# Patient Record
Sex: Female | Born: 1975 | Race: Black or African American | Hispanic: No | Marital: Single | Smoking: Former smoker
Health system: Southern US, Community
[De-identification: ages and names within clinical notes are randomized; demographics above are authoritative.]

## PROBLEM LIST (undated history)

## (undated) DIAGNOSIS — N760 Acute vaginitis: Secondary | ICD-10-CM

## (undated) DIAGNOSIS — B9689 Other specified bacterial agents as the cause of diseases classified elsewhere: Secondary | ICD-10-CM

## (undated) DIAGNOSIS — N83201 Unspecified ovarian cyst, right side: Principal | ICD-10-CM

## (undated) DIAGNOSIS — J45909 Unspecified asthma, uncomplicated: Secondary | ICD-10-CM

## (undated) DIAGNOSIS — I10 Essential (primary) hypertension: Secondary | ICD-10-CM

## (undated) HISTORY — DX: Unspecified ovarian cyst, right side: N83.201

## (undated) HISTORY — DX: Acute vaginitis: N76.0

## (undated) HISTORY — DX: Other specified bacterial agents as the cause of diseases classified elsewhere: B96.89

---

## 2016-12-07 ENCOUNTER — Emergency Department: Payer: Medicaid - Out of State

## 2016-12-07 ENCOUNTER — Encounter: Payer: Self-pay | Admitting: Emergency Medicine

## 2016-12-07 ENCOUNTER — Emergency Department
Admission: EM | Admit: 2016-12-07 | Discharge: 2016-12-07 | Disposition: A | Discharge status: Home / Self Care | Payer: Medicaid - Out of State | Attending: Emergency Medicine | Admitting: Emergency Medicine

## 2016-12-07 DIAGNOSIS — R1031 Right lower quadrant pain: Secondary | ICD-10-CM

## 2016-12-07 DIAGNOSIS — Z87891 Personal history of nicotine dependence: Secondary | ICD-10-CM | POA: Insufficient documentation

## 2016-12-07 DIAGNOSIS — I10 Essential (primary) hypertension: Secondary | ICD-10-CM | POA: Insufficient documentation

## 2016-12-07 DIAGNOSIS — N949 Unspecified condition associated with female genital organs and menstrual cycle: Secondary | ICD-10-CM | POA: Diagnosis not present

## 2016-12-07 DIAGNOSIS — J45909 Unspecified asthma, uncomplicated: Secondary | ICD-10-CM | POA: Diagnosis not present

## 2016-12-07 HISTORY — DX: Essential (primary) hypertension: I10

## 2016-12-07 HISTORY — DX: Unspecified asthma, uncomplicated: J45.909

## 2016-12-07 LAB — COMPREHENSIVE METABOLIC PANEL
ALT: 10 U/L — AB (ref 14–54)
AST: 17 U/L (ref 15–41)
Albumin: 3.8 g/dL (ref 3.5–5.0)
Alkaline Phosphatase: 55 U/L (ref 38–126)
Anion gap: 5 (ref 5–15)
BUN: 13 mg/dL (ref 6–20)
CHLORIDE: 103 mmol/L (ref 101–111)
CO2: 28 mmol/L (ref 22–32)
Calcium: 8.9 mg/dL (ref 8.9–10.3)
Creatinine, Ser: 0.85 mg/dL (ref 0.44–1.00)
GFR calc Af Amer: >60 mL/min (ref >60)
Glucose, Bld: 104 mg/dL — ABNORMAL HIGH (ref 65–99)
POTASSIUM: 3.8 mmol/L (ref 3.5–5.1)
SODIUM: 136 mmol/L (ref 135–145)
Total Bilirubin: 0.8 mg/dL (ref 0.3–1.2)
Total Protein: 6.5 g/dL (ref 6.5–8.1)

## 2016-12-07 LAB — URINALYSIS, COMPLETE (UACMP) WITH MICROSCOPIC
Bilirubin Urine: NEGATIVE
GLUCOSE, UA: NEGATIVE mg/dL
KETONES UR: NEGATIVE mg/dL
Leukocytes, UA: NEGATIVE
Nitrite: NEGATIVE
PROTEIN: NEGATIVE mg/dL
Specific Gravity, Urine: 1.014 (ref 1.005–1.030)
pH: 5 (ref 5.0–8.0)

## 2016-12-07 LAB — CBC
HEMATOCRIT: 40 % (ref 35.0–47.0)
HEMOGLOBIN: 13.2 g/dL (ref 12.0–16.0)
MCH: 31 pg (ref 26.0–34.0)
MCHC: 33 g/dL (ref 32.0–36.0)
MCV: 94.1 fL (ref 80.0–100.0)
Platelets: 171 10*3/uL (ref 150–440)
RBC: 4.25 MIL/uL (ref 3.80–5.20)
RDW: 14.9 % — ABNORMAL HIGH (ref 11.5–14.5)
WBC: 6.6 10*3/uL (ref 3.6–11.0)

## 2016-12-07 LAB — LIPASE, BLOOD: LIPASE: 28 U/L (ref 11–51)

## 2016-12-07 LAB — PREGNANCY, URINE: Preg Test, Ur: NEGATIVE

## 2016-12-07 MED ORDER — SODIUM CHLORIDE 0.9 % IV BOLUS (SEPSIS)
1000.0000 mL | Freq: Once | INTRAVENOUS | Status: AC
  Administered 2016-12-07: 1000 mL via INTRAVENOUS

## 2016-12-07 MED ORDER — IOPAMIDOL (ISOVUE-300) INJECTION 61%
30.0000 mL | Freq: Once | INTRAVENOUS | Status: AC | PRN
  Administered 2016-12-07: 30 mL via ORAL

## 2016-12-07 MED ORDER — MORPHINE SULFATE (PF) 4 MG/ML IV SOLN
4.0000 mg | Freq: Once | INTRAVENOUS | Status: AC
  Administered 2016-12-07: 4 mg via INTRAVENOUS
  Filled 2016-12-07: qty 1

## 2016-12-07 MED ORDER — IOPAMIDOL (ISOVUE-300) INJECTION 61%
100.0000 mL | Freq: Once | INTRAVENOUS | Status: AC | PRN
  Administered 2016-12-07: 100 mL via INTRAVENOUS

## 2016-12-07 MED ORDER — HYDROCODONE-ACETAMINOPHEN 5-325 MG PO TABS: 1.0000 | ORAL_TABLET | ORAL | 0 refills | Status: DC | PRN

## 2016-12-07 MED ORDER — ONDANSETRON 4 MG PO TBDP: 4.0000 mg | ORAL_TABLET | Freq: Three times a day (TID) | ORAL | 0 refills | Status: DC | PRN

## 2016-12-07 MED ORDER — ONDANSETRON HCL 4 MG/2ML IJ SOLN
4.0000 mg | Freq: Once | INTRAMUSCULAR | Status: AC
  Administered 2016-12-07: 4 mg via INTRAVENOUS
  Filled 2016-12-07: qty 2

## 2016-12-07 NOTE — Discharge Instructions (Signed)
Please follow-up with Dr. Jean RosenthalJackson tomorrow morning at 10 AM. Please arrive 20 minutes early. Please take your pain medication as needed, as prescribed. Return to the emergency department for any acute worsening of pain, fever, or any other symptom personally concerning to yourself.

## 2016-12-07 NOTE — ED Provider Notes (Signed)
[facility] Emergency Department Provider Note  Time seen: 8:23 AM  I have reviewed the triage vital signs and the nursing notes.   HISTORY  Chief Complaint Flank Pain    HPI Autumn Lewis is a 41 y.o. female with a past medical history of hypertension, asthma, presents to the emergency department right lower quadrant abdominal pain. According to the patient for the past 2 days she's had a progressively worsening right lower quadrant pain that radiated somewhat into her right back. Patient states nausea but denies vomiting. States some pain when she urinates. Denies vaginal bleeding or discharge. Her last menstrual period was approximately 2 weeks ago. States a history of one prior kidney stone 2 years ago, but says this does not feel similar. Denies hematuria. Largely negative review of systems.  Past Medical History:  Diagnosis Date  . Asthma   . Hypertension     There are no active problems to display for this patient.   History reviewed. No pertinent surgical history.  Prior to Admission medications   Not on File    No Known Allergies  History reviewed. No pertinent family history.  Social History Social History  Substance Use Topics  . Smoking status: Former Games developer  . Smokeless tobacco: Never Used  . Alcohol use Yes    Review of Systems Constitutional: Negative for fever. ENT: Negative for congestion Cardiovascular: Negative for chest pain. Respiratory: Negative for shortness of breath. Gastrointestinal: Right lower quadrant pain. Positive for nausea. Negative for vomiting or diarrhea. Genitourinary: Negative for dysuria. Negative for hematuria. Negative vaginal bleeding or discharge. Musculoskeletal: Some radiation of the pain to the right lower back Skin: Negative for rash. Neurological: Negative for headache All other ROS negative  ____________________________________________   PHYSICAL EXAM:  VITAL SIGNS: ED Triage Vitals   Enc Vitals Group     BP --      Pulse --      Resp --      Temp 12/07/16 0801 98.6 F (37 C)     Temp Source 12/07/16 0801 Oral     SpO2 --      Weight 12/07/16 0800 138 lb (62.6 kg)     Height 12/07/16 0800 5\' 5"  (1.651 m)     Head Circumference --      Peak Flow --      Pain Score 12/07/16 0800 7     Pain Loc --      Pain Edu? --      Excl. in GC? --     Constitutional: Alert and oriented. Well appearing and in no distress. Eyes: Normal exam ENT   Head: Normocephalic and atraumatic   Mouth/Throat: Mucous membranes are moist. Cardiovascular: Normal rate, regular rhythm. No murmur Respiratory: Normal respiratory effort without tachypnea nor retractions. Breath sounds are clear Gastrointestinal: Soft, moderate right lower quadrant tenderness to palpation, rebound tenderness, no guarding, no distention. Musculoskeletal: Nontender with normal range of motion in all extremities. No lower extremity tenderness or edema. Some tenderness in the right great toe which she states is been ongoing for years. Neurologic:  Normal speech and language. No gross focal neurologic deficits  Skin:  Skin is warm, dry and intact.  Psychiatric: Mood and affect are normal.   ____________________________________________   RADIOLOGY  IMPRESSION: 1. 6 cm septated right adnexal cyst which needs sonographic workup. No superimposed inflammatory changes to suggest TOA in this patient with right lower quadrant pain. 2. Negative for appendicitis. 3. Question small hydrosalpinx on the left.  4. 22 mm mass along the low left inguinal canal favoring ascitic fluid within a hernia. The upper inguinal canal is not enlarged, and a round ligament mass versus is not excluded. Recommend physical exam correlation. 5. 2.6 cm right adrenal mass, usually an adenoma but not definitively characterized on this exam. At this size, guidelines recommend adrenal protocol CT if there is no outside comparison. 6. 10  mm left renal calculus, nonobstructing. 7. Aortic Atherosclerosis (ICD10-I70.0).   IMPRESSION: Three large right ovarian cysts, including a complicated 4.6 cm right ovarian cyst. Since these may be difficult to assess completely with US, further evaluation of simple-appearing cysts >7 cm with MRI or surgical evaluation is recommended according to the Society of Radiologists in Ultrasound 2010 Consensus Conference Statement (D Lenis NoonLevine et al. Management of Asymptomatic Ovarian and other Adnexal Cysts Imaged at US: Society of Radiologists in Ultrasound Consensus Conference Statement 2010. Radiology 256 (Sept 2010): 943-954.).  Normal blood flow to both ovaries.  Normal appearance of the uterus.  ____________________________________________   INITIAL IMPRESSION / ASSESSMENT AND PLAN / ED COURSE  Pertinent labs & imaging results that were available during my care of the patient were reviewed by me and considered in my medical decision making (see chart for details).  The patient presents to the emergency department for right lower quadrant abdominal pain ongoing for the past 2 days progressively worsening with nausea. Denies vomiting or diarrhea. States some pain when she urinates, but denies burning pain she states the pain is more in her right lower quadrant when she urinates. Patient has a history of one prior kidney stone in the past. However she states this does not feel similar. Patient has moderate right lower quadrant tenderness, has rebound tenderness when releasing left lower quadrant palpation, has a positive heel tap sign. Exam is suspicious for acute appendicitis, differential would also include urinary pathology such as UTI or ureteral stone.  CT scan shows large right adnexal cyst. Ultrasound shows 3 large ovarian cysts with a complicated 4.6 cm cysts and 2 other larger but benign appearing cyst. This could very likely be the cause of the patient's discomfort. I discussed the  patient with Dr. Tiburcio PeaHarris of OB/GYN. He states the patient will likely need surgery for cyst removal. Patient states her pain is down to a 5 out of 10. I discussed the options with the patient as far as admission to the hospital for gynecologic consultation and possible surgery versus discharge home with outpatient follow-up in the office tomorrow. Patient would prefer to go home. We will discharge the pain and nausea medication. I discussed with the patient return precautions for any acute worsening pain or fever. The patient is agreeable to this plan. We will call Dr. Johnathan HausenHarris's office to arrange a follow-up appointment tomorrow for the patient.   ____________________________________________   FINAL CLINICAL IMPRESSION(S) / ED DIAGNOSES  Right lower quadrant pain Right adnexal cyst   Minna AntisPaduchowski, Genni Buske, MD 12/07/16 1325

## 2016-12-07 NOTE — ED Notes (Signed)
ED Provider at bedside. 

## 2016-12-07 NOTE — ED Notes (Signed)
CT notified that pt is ready for scan.

## 2016-12-07 NOTE — ED Triage Notes (Addendum)
Pt c/o right flank pain radiating to right groin.  Started couple days ago. Does have hx kidney stones, feels similar per pt. Pt also reports 2 periods in May. Does have nausea. No hematuria per pt.

## 2016-12-08 ENCOUNTER — Encounter: Payer: Self-pay | Admitting: Obstetrics and Gynecology

## 2016-12-08 ENCOUNTER — Ambulatory Visit (INDEPENDENT_AMBULATORY_CARE_PROVIDER_SITE_OTHER): Payer: 59 | Admitting: Obstetrics and Gynecology

## 2016-12-08 VITALS — BP 124/78 | Ht 65.0 in | Wt 141.0 lb

## 2016-12-08 DIAGNOSIS — N83201 Unspecified ovarian cyst, right side: Secondary | ICD-10-CM

## 2016-12-08 NOTE — Progress Notes (Signed)
Obstetrics & Gynecology Office Visit   Chief Complaint  Patient presents with  . Follow-up  Right lower quadrant pain Follow up from ER for right ovarian/adnexal cysts  History of Present Illness: 41 y.o. G0P0000 female seen today in follow up from an ER visit yesterday.  She presented to the ER with progressively worsening right lower quadrant abdominal pain. The pain radiated to her back somewhat.  She had a pelvic ultrasound that showed the following: Per report Uterus: 8.6 x 4.2 x 4.7 cm, no fibroids or other masses seen. Endometrium: 3 mm thickness without obvious abnormalities  Right ovary: 7.5 x 4.7 x 5.0 cm.  There is an 8.0 x 2.1 x 5.9 cm simple-appearing right ovarian cyst,  A 3.7 x 4.6 x 4.3 cm simple ovarian cyst and a 4.6 x 2.8 x 2.8 complicated right ovarian cyst without internal blood flow.  Left ovary:  3.3 x 1.4 x 2.0 cm. Normal in appearance.   Normal pulse doppler wave forms.  She has no symptoms today but would like to understand management of the cysts.     Past Medical History:  Diagnosis Date  . Asthma   . Hypertension    Past Surgical history: denies  Gynecologic History: Patient's last menstrual period was 11/19/2016 (approximate).  Obstetric History: G0P0000  Family History  Problem Relation Age of Onset  . Hypertension Father   . Diabetes Mellitus II Maternal Grandfather     Social History   Social History  . Marital status: Single    Spouse name: N/A  . Number of children: N/A  . Years of education: N/A   Occupational History  . Not on file.   Social History Main Topics  . Smoking status: Former Games developer  . Smokeless tobacco: Never Used  . Alcohol use Yes  . Drug use: No  . Sexual activity: Yes    Birth control/ protection: None   Other Topics Concern  . Not on file   Social History Narrative  . No narrative on file   Allergies: No Known Allergies    Medication Sig Start Date End Date Taking? Authorizing Provider    albuterol (PROVENTIL HFA;VENTOLIN HFA) 108 (90 Base) MCG/ACT inhaler Inhale 2 puffs into the lungs every 6 (six) hours as needed for wheezing or shortness of breath.    [provider]  fluticasone furoate-vilanterol (BREO ELLIPTA) 200-25 MCG/INH AEPB Inhale 1 puff into the lungs daily.    [provider]  hydrochlorothiazide (HYDRODIURIL) 25 MG tablet Take 25 mg by mouth daily.    [provider]  HYDROcodone-acetaminophen (NORCO/VICODIN) 5-325 MG tablet Take 1 tablet by mouth every 4 (four) hours as needed. 12/07/16   Minna Antis, MD  loratadine (CLARITIN) 10 MG tablet Take 10 mg by mouth daily.    [provider]  montelukast (SINGULAIR) 10 MG tablet Take 10 mg by mouth at bedtime.    [provider]  ondansetron (ZOFRAN ODT) 4 MG disintegrating tablet Take 1 tablet (4 mg total) by mouth every 8 (eight) hours as needed for nausea or vomiting. 12/07/16   Minna Antis, MD   Review of Systems  Constitutional: Negative.   HENT: Negative.   Eyes: Negative.   Respiratory: Negative.   Cardiovascular: Negative.   Gastrointestinal: Negative.   Genitourinary: Negative.   Musculoskeletal: Negative.   Skin: Negative.   Neurological: Negative.   Psychiatric/Behavioral: Negative.      Physical Exam BP 124/78   Ht 5\' 5"  (1.651 m)   Wt 141  lb (64 kg)   LMP 11/19/2016 (Approximate) Comment: neg preg test  BMI 23.46 kg/m  Patient's last menstrual period was 11/19/2016 (approximate). Physical Exam  Constitutional: She is oriented to person, place, and time. She appears well-developed and well-nourished. No distress.  HENT:  Head: Normocephalic and atraumatic.  Eyes: EOM are normal. No scleral icterus.  Neck: Normal range of motion. Neck supple.  Cardiovascular: Normal rate and regular rhythm.   Pulmonary/Chest: Effort normal and breath sounds normal. No respiratory distress. She has no wheezes. She has no rales.  Abdominal: Soft. Bowel  sounds are normal. She exhibits no distension and no mass. There is no tenderness. There is no rebound and no guarding.  Musculoskeletal: Normal range of motion. She exhibits no edema.  Neurological: She is alert and oriented to person, place, and time. No cranial nerve deficit.  Skin: Skin is warm and dry. No erythema.  Psychiatric: She has a normal mood and affect. Her behavior is normal. Judgment normal.    Assessment: 41 y.o. G0P0000 female with several right ovarian cysts, one of them large and one of them complicated Plan: Problem List Items Addressed This Visit    Right ovarian cyst - Primary   Relevant Orders   MR PELVIS W WO CONTRAST     Long discussion regarding management. She prefers to avoid surgery. It is possible that we could watch the cysts. Recommendation for MRI for further characterization of the cysts. She understands that she might still need surgery to remove the cysts.  Torsion precautions given to her in case pain returns.  If pain does return, may need emergent surgery or surgery prior to MRI characterization. Discussed that cysts are most likely benign.  But, I can not tell her definitively that they are without removing them. MRI would give best characterization of the cysts.  30 minutes spent in face to face discussion with > 50% spent in counseling and management of her ovarian cysts. I also personally reviewed her ultrasound images and report along with her lab results and notes from her ER visit.  Thomasene MohairStephen Bristol Osentoski, MD 12/09/2016 6:21 PM

## 2016-12-09 ENCOUNTER — Encounter: Payer: Self-pay | Admitting: Obstetrics and Gynecology

## 2016-12-09 DIAGNOSIS — N83201 Unspecified ovarian cyst, right side: Secondary | ICD-10-CM

## 2016-12-09 HISTORY — DX: Unspecified ovarian cyst, right side: N83.201

## 2016-12-15 ENCOUNTER — Ambulatory Visit
Admission: RE | Admit: 2016-12-15 | Discharge: 2016-12-15 | Disposition: A | Discharge status: Home / Self Care | Payer: Medicaid - Out of State | Source: Ambulatory Visit | Attending: Obstetrics and Gynecology | Admitting: Obstetrics and Gynecology

## 2016-12-15 ENCOUNTER — Telehealth: Payer: Self-pay | Admitting: Obstetrics and Gynecology

## 2016-12-15 DIAGNOSIS — N83201 Unspecified ovarian cyst, right side: Secondary | ICD-10-CM | POA: Diagnosis present

## 2016-12-15 MED ORDER — GADOBENATE DIMEGLUMINE 529 MG/ML IV SOLN
13.0000 mL | Freq: Once | INTRAVENOUS | Status: AC | PRN
  Administered 2016-12-15: 13 mL via INTRAVENOUS

## 2016-12-15 NOTE — Telephone Encounter (Signed)
Lvm for patient to call back to confirm [facility]

## 2016-12-15 NOTE — Telephone Encounter (Signed)
Discussed results.  Significant decrease in overall size of cysts, largest now measuring 3.0 x 2 cm.   Mention of likely hydrosalpinx made. Will have patient come to clinic for STD scree, just in case.  She voiced understanding. Will have f/u u/s in 4-6 months.

## 2016-12-19 ENCOUNTER — Ambulatory Visit: Payer: 59 | Admitting: Obstetrics and Gynecology

## 2016-12-19 ENCOUNTER — Ambulatory Visit (INDEPENDENT_AMBULATORY_CARE_PROVIDER_SITE_OTHER): Payer: 59 | Admitting: Obstetrics and Gynecology

## 2016-12-19 VITALS — BP 124/70 | Ht 66.0 in | Wt 142.0 lb

## 2016-12-19 DIAGNOSIS — Z113 Encounter for screening for infections with a predominantly sexual mode of transmission: Secondary | ICD-10-CM

## 2016-12-19 DIAGNOSIS — L732 Hidradenitis suppurativa: Secondary | ICD-10-CM | POA: Insufficient documentation

## 2016-12-19 HISTORY — DX: Hidradenitis suppurativa: L73.2

## 2016-12-19 MED ORDER — CLINDAMYCIN PHOSPHATE 1 % EX GEL: Freq: Two times a day (BID) | CUTANEOUS | 0 refills | Status: DC

## 2016-12-19 NOTE — Progress Notes (Signed)
Patient returned to clinic, per my request to obtain STD screening due to finding of hydrosalpinx on MRI. No charge for visit.    WIll follow up with results. She has no symptoms today.  Thomasene MohairStephen Jackson, MD 12/19/2016 9:31 AM

## 2016-12-21 LAB — CHLAMYDIA/GONOCOCCUS/TRICHOMONAS, NAA
CHLAMYDIA BY NAA: NEGATIVE
Gonococcus by NAA: NEGATIVE
TRICH VAG BY NAA: NEGATIVE

## 2016-12-23 ENCOUNTER — Telehealth: Payer: Self-pay

## 2016-12-23 NOTE — Telephone Encounter (Signed)
Pt calling for results from last visit.  Adv cultures were neg.  She wants to know what next step is so [facility]  [phone]

## 2016-12-26 NOTE — Telephone Encounter (Signed)
Patient left message on my v/m requesting results.

## 2016-12-26 NOTE — Telephone Encounter (Signed)
Will you please call this patient and let her know that I will call her tomorrow?  I am out of the office today and will call her as soon as I can.  Thank you. Also, send this task back to me, so that I will have a reminder.

## 2016-12-27 NOTE — Telephone Encounter (Signed)
Generic VM left.

## 2016-12-27 NOTE — Telephone Encounter (Signed)
fwding task back to [facility] to call pt today

## 2017-01-14 ENCOUNTER — Other Ambulatory Visit: Payer: Self-pay | Admitting: Obstetrics and Gynecology

## 2017-01-14 DIAGNOSIS — L732 Hidradenitis suppurativa: Secondary | ICD-10-CM

## 2017-01-19 ENCOUNTER — Encounter: Payer: Self-pay | Admitting: Obstetrics and Gynecology

## 2017-01-19 NOTE — Telephone Encounter (Signed)
Left another VM. Will send the patient a letter outlining the plan.

## 2017-01-27 ENCOUNTER — Telehealth: Payer: Self-pay | Admitting: Obstetrics and Gynecology

## 2017-01-27 NOTE — Telephone Encounter (Signed)
Please advise 

## 2017-01-27 NOTE — Telephone Encounter (Signed)
Patient called to discuss what needs to be done about the fluid in her fallopian tubes. Please call back [phone].

## 2017-01-31 NOTE — Telephone Encounter (Signed)
Duplicate task.

## 2017-02-13 ENCOUNTER — Telehealth: Payer: Self-pay

## 2017-02-13 NOTE — Telephone Encounter (Signed)
No lab result or notation of why she would be on this medicine.  Deny refill.

## 2017-02-13 NOTE — Telephone Encounter (Signed)
Pharmacy requesting a refill of the Clindamycin Gel 1%. [facility] patient. Last seen 11/2016. Please advise

## 2017-02-25 DIAGNOSIS — B9689 Other specified bacterial agents as the cause of diseases classified elsewhere: Secondary | ICD-10-CM

## 2017-02-25 DIAGNOSIS — N760 Acute vaginitis: Secondary | ICD-10-CM

## 2017-02-25 HISTORY — DX: Other specified bacterial agents as the cause of diseases classified elsewhere: N76.0

## 2017-02-25 HISTORY — DX: Other specified bacterial agents as the cause of diseases classified elsewhere: B96.89

## 2017-03-07 ENCOUNTER — Ambulatory Visit (INDEPENDENT_AMBULATORY_CARE_PROVIDER_SITE_OTHER): Payer: 59 | Admitting: Obstetrics and Gynecology

## 2017-03-07 ENCOUNTER — Other Ambulatory Visit: Payer: Self-pay | Admitting: Obstetrics and Gynecology

## 2017-03-07 ENCOUNTER — Other Ambulatory Visit (INDEPENDENT_AMBULATORY_CARE_PROVIDER_SITE_OTHER): Payer: 59

## 2017-03-07 ENCOUNTER — Encounter: Payer: Self-pay | Admitting: Obstetrics and Gynecology

## 2017-03-07 VITALS — BP 122/82 | HR 65 | Ht 65.0 in | Wt 147.0 lb

## 2017-03-07 DIAGNOSIS — R1031 Right lower quadrant pain: Secondary | ICD-10-CM | POA: Diagnosis not present

## 2017-03-07 DIAGNOSIS — Z8742 Personal history of other diseases of the female genital tract: Secondary | ICD-10-CM | POA: Diagnosis not present

## 2017-03-07 DIAGNOSIS — B9689 Other specified bacterial agents as the cause of diseases classified elsewhere: Secondary | ICD-10-CM | POA: Diagnosis not present

## 2017-03-07 DIAGNOSIS — N83201 Unspecified ovarian cyst, right side: Secondary | ICD-10-CM | POA: Diagnosis not present

## 2017-03-07 DIAGNOSIS — N760 Acute vaginitis: Secondary | ICD-10-CM | POA: Diagnosis not present

## 2017-03-07 DIAGNOSIS — K047 Periapical abscess without sinus: Secondary | ICD-10-CM | POA: Insufficient documentation

## 2017-03-07 HISTORY — DX: Periapical abscess without sinus: K04.7

## 2017-03-07 LAB — POCT WET PREP WITH KOH
Clue Cells Wet Prep HPF POC: POSITIVE
KOH PREP POC: POSITIVE — AB
Trichomonas, UA: NEGATIVE
Yeast Wet Prep HPF POC: NEGATIVE

## 2017-03-07 MED ORDER — METRONIDAZOLE 500 MG PO TABS: 500.0000 mg | ORAL_TABLET | Freq: Two times a day (BID) | ORAL | 0 refills | Status: AC

## 2017-03-07 MED ORDER — AMOXICILLIN 500 MG PO TABS: 500.0000 mg | ORAL_TABLET | Freq: Two times a day (BID) | ORAL | 0 refills | Status: AC

## 2017-03-07 NOTE — Progress Notes (Signed)
Chief Complaint  Patient presents with  . Ovarian Cyst    right sided pain,     HPI:      Autumn Lewis is a 41 y.o. G0P0000 who LMP was Patient's last menstrual period was 02/12/2017., presents today for RLQ pelvic pain. She was seen in ED 6/18 and diagnosed with 3 ovarian cysts (Right ovary: 7.5 x 4.7 x 5.0 cm.  There is an 8.0 x 2.1 x 5.9 cm simple-appearing right ovarian cyst,  A 3.7 x 4.6 x 4.3 cm simple ovarian cyst and a 4.6 x 2.8 x 2.8 complicated right ovarian cyst without internal blood flow.). She then saw Dr. Jean RosenthalJackson and given size and complexity of cysts, pt had MRI. MRI showed Significant decrease in overall size of cysts, largest measuring 3.0 x 2 cm.  Mention of likely hydrosalpinx made.  Pt had neg gon/chlam test. She states sx somewhat improved but never resolved.   She has now had worsening RLQ pain for the past wk. Pain is intermittent and sharp, causing pt to bend over. Sx improved with 1/2 vicodin yesterday and 1 vicodin today (given to pt by Dr. Jean RosenthalJackson). Pt was going to go back to ED but since pain improved, came in today. She has noted an increased d/c with odor, mild irritation for the past couple of days. She has a hx of BV in the past and has been treated with metronidazole with sx relief. She is not currently sex active.   Pt denies any urin sx, GI sx, fevers. She has monthly menses.   She also complains of a tooth abscess with pain for the past few days. She grinds her teeth and hates going to the dentist. She took 1 leftover amox today. Side of mouth is swollen and tender.    Past Medical History:  Diagnosis Date  . Asthma   . Hypertension   . Hypertension   . Right ovarian cyst 12/09/2016    History reviewed. No pertinent surgical history.  Family History  Problem Relation Age of Onset  . Hypertension Father   . Diabetes Mellitus II Maternal Grandfather     Social History   Social History  . Marital status: Single    Spouse name: N/A  .  Number of children: N/A  . Years of education: N/A   Occupational History  . Not on file.   Social History Main Topics  . Smoking status: Former Games developermoker  . Smokeless tobacco: Never Used  . Alcohol use Yes  . Drug use: No  . Sexual activity: Yes    Birth control/ protection: None   Other Topics Concern  . Not on file   Social History Narrative  . No narrative on file     Current Outpatient Prescriptions:  .  albuterol (PROVENTIL HFA;VENTOLIN HFA) 108 (90 Base) MCG/ACT inhaler, Inhale 2 puffs into the lungs every 6 (six) hours as needed for wheezing or shortness of breath., Disp: , Rfl:  .  amoxicillin (AMOXIL) 500 MG tablet, Take 1 tablet (500 mg total) by mouth 2 (two) times daily., Disp: 20 tablet, Rfl: 0 .  clindamycin (CLINDAGEL) 1 % gel, Apply topically 2 (two) times daily., Disp: 30 g, Rfl: 0 .  fluticasone furoate-vilanterol (BREO ELLIPTA) 200-25 MCG/INH AEPB, Inhale 1 puff into the lungs daily., Disp: , Rfl:  .  hydrochlorothiazide (HYDRODIURIL) 25 MG tablet, Take 25 mg by mouth daily., Disp: , Rfl:  .  HYDROcodone-acetaminophen (NORCO/VICODIN) 5-325 MG tablet, Take 1 tablet by mouth  every 4 (four) hours as needed., Disp: 15 tablet, Rfl: 0 .  loratadine (CLARITIN) 10 MG tablet, Take 10 mg by mouth daily., Disp: , Rfl:  .  metroNIDAZOLE (FLAGYL) 500 MG tablet, Take 1 tablet (500 mg total) by mouth 2 (two) times daily., Disp: 14 tablet, Rfl: 0 .  montelukast (SINGULAIR) 10 MG tablet, Take 10 mg by mouth at bedtime., Disp: , Rfl:  .  ondansetron (ZOFRAN ODT) 4 MG disintegrating tablet, Take 1 tablet (4 mg total) by mouth every 8 (eight) hours as needed for nausea or vomiting., Disp: 20 tablet, Rfl: 0   ROS:  Review of Systems  Constitutional: Negative for fever.  Gastrointestinal: Positive for nausea. Negative for blood in stool, constipation, diarrhea and vomiting.  Genitourinary: Positive for pelvic pain and vaginal discharge. Negative for dyspareunia, dysuria, flank  pain, frequency, hematuria, urgency, vaginal bleeding and vaginal pain.  Musculoskeletal: Positive for back pain.  Skin: Negative for rash.     OBJECTIVE:   Vitals:  BP 122/82   Pulse 65   Ht  (1.651 m)   Wt 147 lb (66.7 kg)   LMP 02/12/2017   BMI 24.46 kg/m   Physical Exam  Constitutional: She is oriented to person, place, and time and well-developed, well-nourished, and in no distress. Vital signs are normal.  HENT:  Mouth/Throat: Dental abscesses and dental caries present.    Abdominal: Soft. There is tenderness in the right lower quadrant. There is guarding. There is no rigidity.  Genitourinary: Uterus normal, cervix normal, right adnexa normal, left adnexa normal and vulva normal. Uterus is not enlarged. Cervix exhibits no motion tenderness and no tenderness. Right adnexum displays no mass and no tenderness. Left adnexum displays no mass and no tenderness. Vulva exhibits no erythema, no exudate, no lesion, no rash and no tenderness. Vagina exhibits no lesion. Thick  odorless  white and vaginal discharge found.  Neurological: She is oriented to person, place, and time.  Vitals reviewed.   Results: Results for orders placed or performed in visit on 03/07/17 (from the past 24 hour(s))  POCT Wet Prep with KOH     Status: Abnormal   Collection Time: 03/07/17  3:04 PM  Result Value Ref Range   Trichomonas, UA Negative    Clue Cells Wet Prep HPF POC pos    Epithelial Wet Prep HPF POC  Few, Moderate, Many, Too numerous to count   Yeast Wet Prep HPF POC neg    Bacteria Wet Prep HPF POC  Few   RBC Wet Prep HPF POC     WBC Wet Prep HPF POC     KOH Prep POC Positive (A) Negative   GYN U/S-->EM=7.34 MM; NO FF IN CDS; BILAT OVAR WNL  Assessment/Plan: RLQ abdominal pain - Neg GYN u/s, NT on pelvic exam. Question related to BV. Treat with flagyl and see if sx improve. F/u prn.   Cyst of right ovary - Resolved.  Bacterial vaginosis - Rx flagyl.  F/u prn. - Plan:  metroNIDAZOLE (FLAGYL) 500 MG tablet, POCT Wet Prep with KOH  Tooth abscess - Rx amox eRxd. Warm salt water swishes. F/u wiht dentist. - Plan: amoxicillin (AMOXIL) 500 MG tablet    Meds ordered this encounter  Medications  . metroNIDAZOLE (FLAGYL) 500 MG tablet    Sig: Take 1 tablet (500 mg total) by mouth 2 (two) times daily.    Dispense:  14 tablet    Refill:  0  . amoxicillin (AMOXIL) 500 MG tablet  Sig: Take 1 tablet (500 mg total) by mouth 2 (two) times daily.    Dispense:  20 tablet    Refill:  0      Return if symptoms worsen or fail to improve.  Raychel Dowler B. Verl Kitson, PA-C 03/07/2017 3:31 PM

## 2017-05-24 ENCOUNTER — Ambulatory Visit: Payer: 59 | Admitting: Obstetrics and Gynecology

## 2017-05-25 ENCOUNTER — Encounter: Payer: Self-pay | Admitting: Obstetrics and Gynecology

## 2017-05-25 ENCOUNTER — Ambulatory Visit (INDEPENDENT_AMBULATORY_CARE_PROVIDER_SITE_OTHER): Payer: 59 | Admitting: Obstetrics and Gynecology

## 2017-05-25 VITALS — BP 138/80 | HR 72 | Ht 65.0 in | Wt 147.0 lb

## 2017-05-25 DIAGNOSIS — B9689 Other specified bacterial agents as the cause of diseases classified elsewhere: Secondary | ICD-10-CM | POA: Diagnosis not present

## 2017-05-25 DIAGNOSIS — R1031 Right lower quadrant pain: Secondary | ICD-10-CM

## 2017-05-25 DIAGNOSIS — N760 Acute vaginitis: Secondary | ICD-10-CM

## 2017-05-25 LAB — POCT URINALYSIS DIPSTICK
Bilirubin, UA: NEGATIVE
GLUCOSE UA: NEGATIVE
Ketones, UA: NEGATIVE
NITRITE UA: NEGATIVE
PROTEIN UA: NEGATIVE
Spec Grav, UA: 1.02 (ref 1.010–1.025)
pH, UA: 6 (ref 5.0–8.0)

## 2017-05-25 LAB — POCT WET PREP WITH KOH
CLUE CELLS WET PREP PER HPF POC: POSITIVE
KOH PREP POC: POSITIVE — AB
TRICHOMONAS UA: NEGATIVE
Yeast Wet Prep HPF POC: NEGATIVE

## 2017-05-25 LAB — POCT URINE PREGNANCY: Preg Test, Ur: NEGATIVE

## 2017-05-25 MED ORDER — METRONIDAZOLE 500 MG PO TABS: 500.0000 mg | ORAL_TABLET | Freq: Two times a day (BID) | ORAL | 0 refills | Status: DC

## 2017-05-25 NOTE — Patient Instructions (Signed)
I value your feedback and entrusting us with your care. If you get a [facility] patient survey, I would appreciate you taking the time to let us know about your experience today. Thank you! 

## 2017-05-25 NOTE — Progress Notes (Signed)
Chief Complaint  Patient presents with  . Pelvic Pain    pain with urination     HPI:      Ms. Woodroe ModeJoanne Dorough is a 41 y.o. G0P0000 who LMP was Patient's last menstrual period was 05/10/2017 (exact date)., presents today for RLQ pain for the past 3 days. Pain is achy and intermittent. No aggrav factors. She doesn't like pain and took a leftover 1/2 vicodin with some relief. She has noted urinary frequency with good flow. She also has an increased d/c and odor, no irritation. She was treated for BV 9/18 with flagyl with sx relief. She is rarely sex active, uses condoms. She is having normal BMs. No fevers.   Hx of 3 large ovar cysts 6/18 that resolved on their own. Pt had neg GYN u/s 9/18.  Menses are monthly.    Past Medical History:  Diagnosis Date  . Asthma   . BV (bacterial vaginosis) 02/2017  . Hypertension   . Right ovarian cyst 12/09/2016    History reviewed. No pertinent surgical history.  Family History  Problem Relation Age of Onset  . Hypertension Father   . Diabetes Mellitus II Maternal Grandfather   . Stroke Mother     Social History   Socioeconomic History  . Marital status: Single    Spouse name: Not on file  . Number of children: Not on file  . Years of education: Not on file  . Highest education level: Not on file  Social Needs  . Financial resource strain: Not on file  . Food insecurity - worry: Not on file  . Food insecurity - inability: Not on file  . Transportation needs - medical: Not on file  . Transportation needs - non-medical: Not on file  Occupational History  . Not on file  Tobacco Use  . Smoking status: Former Games developermoker  . Smokeless tobacco: Never Used  Substance and Sexual Activity  . Alcohol use: Yes  . Drug use: No  . Sexual activity: Yes    Birth control/protection: None  Other Topics Concern  . Not on file  Social History Narrative  . Not on file     Current Outpatient Medications:  .  albuterol (PROVENTIL HFA;VENTOLIN HFA)  108 (90 Base) MCG/ACT inhaler, Inhale 2 puffs into the lungs every 6 (six) hours as needed for wheezing or shortness of breath., Disp: , Rfl:  .  clindamycin (CLINDAGEL) 1 % gel, Apply topically 2 (two) times daily., Disp: 30 g, Rfl: 0 .  fluticasone furoate-vilanterol (BREO ELLIPTA) 200-25 MCG/INH AEPB, Inhale 1 puff into the lungs daily., Disp: , Rfl:  .  hydrochlorothiazide (HYDRODIURIL) 25 MG tablet, Take 25 mg by mouth daily., Disp: , Rfl:  .  HYDROcodone-acetaminophen (NORCO/VICODIN) 5-325 MG tablet, Take 1 tablet by mouth every 4 (four) hours as needed., Disp: 15 tablet, Rfl: 0 .  loratadine (CLARITIN) 10 MG tablet, Take 10 mg by mouth daily., Disp: , Rfl:  .  metroNIDAZOLE (FLAGYL) 500 MG tablet, Take 1 tablet (500 mg total) by mouth 2 (two) times daily for 7 days., Disp: 14 tablet, Rfl: 0 .  montelukast (SINGULAIR) 10 MG tablet, Take 10 mg by mouth at bedtime., Disp: , Rfl:  .  ondansetron (ZOFRAN ODT) 4 MG disintegrating tablet, Take 1 tablet (4 mg total) by mouth every 8 (eight) hours as needed for nausea or vomiting., Disp: 20 tablet, Rfl: 0   ROS:  Review of Systems  Constitutional: Negative for fever.  Gastrointestinal: Negative for blood  in stool, constipation, diarrhea, nausea and vomiting.  Genitourinary: Positive for frequency, pelvic pain and vaginal discharge. Negative for dyspareunia, dysuria, flank pain, hematuria, urgency, vaginal bleeding and vaginal pain.  Musculoskeletal: Negative for back pain.  Skin: Negative for rash.     OBJECTIVE:   Vitals:  BP 138/80   Pulse 72   Ht 5\' 5"  (1.651 m)   Wt 147 lb (66.7 kg)   LMP 05/10/2017 (Exact Date)   BMI 24.46 kg/m   Physical Exam  Constitutional: She is oriented to person, place, and time and well-developed, well-nourished, and in no distress. Vital signs are normal.  Abdominal: Soft. Normal appearance. She exhibits no mass. There is tenderness in the right lower quadrant. There is no rebound.  Genitourinary:  Vagina normal, uterus normal, cervix normal, right adnexa normal, left adnexa normal and vulva normal. Uterus is not enlarged. Cervix exhibits no motion tenderness and no tenderness. Right adnexum displays no mass and no tenderness. Left adnexum displays no mass and no tenderness. Vulva exhibits no erythema, no exudate, no lesion, no rash and no tenderness. Vagina exhibits no lesion.  Neurological: She is oriented to person, place, and time.  Vitals reviewed.   Results: Results for orders placed or performed in visit on 05/25/17 (from the past 24 hour(s))  POCT Urinalysis Dipstick     Status: Abnormal   Collection Time: 05/25/17 12:02 PM  Result Value Ref Range   Color, UA yellow    Clarity, UA cloudy    Glucose, UA neg    Bilirubin, UA neg    Ketones, UA neg    Spec Grav, UA 1.020 1.010 - 1.025   Blood, UA trace    pH, UA 6.0 5.0 - 8.0   Protein, UA neg    Urobilinogen, UA  0.2 or 1.0 E.U./dL   Nitrite, UA neg    Leukocytes, UA Trace (A) Negative  POCT Wet Prep with KOH     Status: Abnormal   Collection Time: 05/25/17 12:02 PM  Result Value Ref Range   Trichomonas, UA Negative    Clue Cells Wet Prep HPF POC pos    Epithelial Wet Prep HPF POC  Few, Moderate, Many, Too numerous to count   Yeast Wet Prep HPF POC neg    Bacteria Wet Prep HPF POC  Few   RBC Wet Prep HPF POC     WBC Wet Prep HPF POC     KOH Prep POC Positive (A) Negative  POCT urine pregnancy     Status: Normal   Collection Time: 05/25/17 12:02 PM  Result Value Ref Range   Preg Test, Ur Negative Negative     Assessment/Plan: Bacterial vaginosis - Pos wet prep. Rx flagyl. F/u prn.  - Plan: POCT Wet Prep with KOH, metroNIDAZOLE (FLAGYL) 500 MG tablet  RLQ abdominal pain - Tender on exam but pt is also very sensitive to pain. Neg UPT, ? dip. Treat for BV, check C&S. If sx persist, will check another u/s. Hx of ovar cysts.  - Plan: POCT Urinalysis Dipstick, Urine Culture, POCT urine pregnancy    Meds ordered  this encounter  Medications  . metroNIDAZOLE (FLAGYL) 500 MG tablet    Sig: Take 1 tablet (500 mg total) by mouth 2 (two) times daily for 7 days.    Dispense:  14 tablet    Refill:  0      Return if symptoms worsen or fail to improve.  Caterin Tabares B. Felisha Claytor, PA-C 05/25/2017 12:11 PM

## 2017-05-27 LAB — URINE CULTURE

## 2017-10-15 ENCOUNTER — Other Ambulatory Visit: Payer: Self-pay | Admitting: Obstetrics and Gynecology

## 2017-10-15 DIAGNOSIS — N760 Acute vaginitis: Principal | ICD-10-CM

## 2017-10-15 DIAGNOSIS — B9689 Other specified bacterial agents as the cause of diseases classified elsewhere: Secondary | ICD-10-CM

## 2017-10-16 NOTE — Telephone Encounter (Signed)
Please advise. If you would like her to schedule let me know and I will do it. Thank you!

## 2017-10-30 ENCOUNTER — Encounter: Payer: Self-pay | Admitting: Emergency Medicine

## 2017-10-30 ENCOUNTER — Emergency Department
Admission: EM | Admit: 2017-10-30 | Discharge: 2017-10-30 | Disposition: A | Discharge status: Home / Self Care | Payer: No Typology Code available for payment source | Attending: Emergency Medicine | Admitting: Emergency Medicine

## 2017-10-30 ENCOUNTER — Other Ambulatory Visit: Payer: Self-pay

## 2017-10-30 ENCOUNTER — Emergency Department: Payer: No Typology Code available for payment source

## 2017-10-30 DIAGNOSIS — S0990XA Unspecified injury of head, initial encounter: Secondary | ICD-10-CM | POA: Insufficient documentation

## 2017-10-30 DIAGNOSIS — Y9289 Other specified places as the place of occurrence of the external cause: Secondary | ICD-10-CM | POA: Diagnosis not present

## 2017-10-30 DIAGNOSIS — Y9389 Activity, other specified: Secondary | ICD-10-CM | POA: Diagnosis not present

## 2017-10-30 DIAGNOSIS — Y990 Civilian activity done for income or pay: Secondary | ICD-10-CM | POA: Insufficient documentation

## 2017-10-30 DIAGNOSIS — S060X0A Concussion without loss of consciousness, initial encounter: Secondary | ICD-10-CM | POA: Insufficient documentation

## 2017-10-30 DIAGNOSIS — W228XXA Striking against or struck by other objects, initial encounter: Secondary | ICD-10-CM | POA: Insufficient documentation

## 2017-10-30 DIAGNOSIS — I10 Essential (primary) hypertension: Secondary | ICD-10-CM | POA: Insufficient documentation

## 2017-10-30 DIAGNOSIS — Z79899 Other long term (current) drug therapy: Secondary | ICD-10-CM | POA: Insufficient documentation

## 2017-10-30 DIAGNOSIS — J45909 Unspecified asthma, uncomplicated: Secondary | ICD-10-CM | POA: Insufficient documentation

## 2017-10-30 DIAGNOSIS — Z87891 Personal history of nicotine dependence: Secondary | ICD-10-CM | POA: Diagnosis not present

## 2017-10-30 MED ORDER — IBUPROFEN 600 MG PO TABS
600.0000 mg | ORAL_TABLET | Freq: Once | ORAL | Status: AC
  Administered 2017-10-30: 600 mg via ORAL
  Filled 2017-10-30: qty 1

## 2017-10-30 MED ORDER — ONDANSETRON 4 MG PO TBDP
4.0000 mg | ORAL_TABLET | Freq: Once | ORAL | Status: AC
Start: 2017-10-30 — End: 2017-10-30
  Administered 2017-10-30: 4 mg via ORAL
  Filled 2017-10-30: qty 1

## 2017-10-30 MED ORDER — IBUPROFEN 600 MG PO TABS: 600.0000 mg | ORAL_TABLET | Freq: Four times a day (QID) | ORAL | 0 refills | Status: DC | PRN

## 2017-10-30 NOTE — ED Triage Notes (Signed)
Says was at work and reached under to get something and did not back out enough and hit her head on the shelf above.  Now with some nausea.

## 2017-10-30 NOTE — ED Notes (Signed)
See triage note  states she hit her head on shelf at work  Bent down and misjudged   Hitting head on shelf  No loc  Positive nausea

## 2017-10-30 NOTE — ED Provider Notes (Signed)
[facility] Emergency Department Provider Note  ____________________________________________  Time seen: Approximately 4:59 PM  I have reviewed the triage vital signs and the nursing notes.   HISTORY  Chief Complaint Head Injury    HPI Autumn Lewis is a 42 y.o. female that presents to the emergency room for evaluation of headache after hitting her head on a shelf at work.  Patient did not lose consciousness.  She states that right after injury she had some blurry vision in her right eye but this has resolved.  She does still have some nausea. Her friend told her there was no knot and no bleeding. She states that she does not do well with pain and that is why she did not have kids. No additional injuries.     Past Medical History:  Diagnosis Date  . Asthma   . BV (bacterial vaginosis) 02/2017  . Hypertension   . Right ovarian cyst 12/09/2016    Patient Active Problem List   Diagnosis Date Noted  . RLQ abdominal pain 03/07/2017  . Bacterial vaginosis 03/07/2017  . 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 Hidradenitis suppurativa 12/19/2016  . Right ovarian cyst 12/09/2016    History reviewed. No pertinent surgical history.  Prior to Admission medications   Medication Sig Start Date End Date Taking? Authorizing Provider  albuterol (PROVENTIL HFA;VENTOLIN HFA) 108 (90 Base) MCG/ACT inhaler Inhale 2 puffs into the lungs every 6 (six) hours as needed for wheezing or shortness of breath.    [provider]  clindamycin (CLINDAGEL) 1 % gel Apply topically 2 (two) times daily. 12/19/16   Conard Novak, MD  fluticasone furoate-vilanterol (BREO ELLIPTA) 200-25 MCG/INH AEPB Inhale 1 puff into the lungs daily.    [provider]  hydrochlorothiazide (HYDRODIURIL) 25 MG tablet Take 25 mg by mouth daily.    [provider]  HYDROcodone-acetaminophen (NORCO/VICODIN) 5-325 MG tablet Take 1 tablet by mouth every 4 (four) hours as needed. 12/07/16    Minna Antis, MD  ibuprofen (ADVIL,MOTRIN) 600 MG tablet Take 1 tablet (600 mg total) by mouth every 6 (six) hours as needed. 10/30/17   Enid Derry, PA-C  loratadine (CLARITIN) 10 MG tablet Take 10 mg by mouth daily.    [provider]  metroNIDAZOLE (FLAGYL) 500 MG tablet TAKE 1 TABLET BY MOUTH TWICE A DAY FOR 7 DAYS 10/16/17   Copland, Alicia B, PA-C  montelukast (SINGULAIR) 10 MG tablet Take 10 mg by mouth at bedtime.    [provider]  ondansetron (ZOFRAN ODT) 4 MG disintegrating tablet Take 1 tablet (4 mg total) by mouth every 8 (eight) hours as needed for nausea or vomiting. 12/07/16   Minna Antis, MD    Allergies Strawberry extract; Milk-related compounds; and Shellfish allergy  Family History  Problem Relation Age of Onset  . Hypertension Father   . Diabetes Mellitus II Maternal Grandfather   . Stroke Mother     Social History Social History   Tobacco Use  . Smoking status: Former Games developer  . Smokeless tobacco: Never Used  Substance Use Topics  . Alcohol use: Yes  . Drug use: No     Review of Systems  Cardiovascular: No chest pain. Respiratory: No SOB. Gastrointestinal: No abdominal pain.  No vomiting. Positive for nausea. Musculoskeletal: Negative for musculoskeletal pain. Skin: Negative for rash, abrasions, lacerations, ecchymosis. Neurological: Negative for numbness or tingling. Positive for headache.   ____________________________________________   PHYSICAL EXAM:  VITAL SIGNS: ED Triage Vitals  Enc Vitals Group  BP 10/30/17 1505 (!) 157/94     Pulse Rate 10/30/17 1505 68     Resp 10/30/17 1505 16     Temp 10/30/17 1505 97.9 F (36.6 C)     Temp Source 10/30/17 1505 Oral     SpO2 10/30/17 1505 100 %     Weight 10/30/17 1506 148 lb (67.1 kg)     Height 10/30/17 1506  (1.651 m)     Head Circumference --      Peak Flow --      Pain Score 10/30/17 1505 8     Pain Loc --      Pain Edu? --      Excl. in GC? --       Constitutional: Alert and oriented. Well appearing and in no acute distress. Eyes: Conjunctivae are normal. PERRL. EOMI. Head: Atraumatic. No bleeding or wound. ENT:      Ears:      Nose: No congestion/rhinnorhea.      Mouth/Throat: Mucous membranes are moist.  Neck: No stridor.  Cardiovascular: Normal rate, regular rhythm.  Good peripheral circulation. Respiratory: Normal respiratory effort without tachypnea or retractions. Lungs CTAB. Good air entry to the bases with no decreased or absent breath sounds. Gastrointestinal: Bowel sounds 4 quadrants. Soft and nontender to palpation. No guarding or rigidity. No palpable masses. No distention.  Musculoskeletal: Full range of motion to all extremities. No gross deformities appreciated. Neurologic:  Normal speech and language. No gross focal neurologic deficits are appreciated.  Skin:  Skin is warm, dry and intact. No rash noted.   ____________________________________________   LABS (all labs ordered are listed, but only abnormal results are displayed)  Labs Reviewed - No data to display ____________________________________________  EKG   ____________________________________________  RADIOLOGY Lexine Baton, personally viewed and evaluated these images (plain radiographs) as part of my medical decision making, as well as reviewing the written report by the radiologist.  Ct Head Wo Contrast  Result Date: 10/30/2017 CLINICAL DATA:  42 year old who struck the back of her head on a shelf at work earlier today, now with mild nausea. No loss of consciousness. Initial encounter. EXAM: CT HEAD WITHOUT CONTRAST TECHNIQUE: Contiguous axial images were obtained from the base of the skull through the vertex without intravenous contrast. COMPARISON:  None. FINDINGS: Brain: Ventricular system normal in size and appearance for age. No mass lesion. No midline shift. No acute hemorrhage or hematoma. No extra-axial fluid collections. No  evidence of acute infarction. No focal brain parenchymal abnormalities. Vascular: No hyperdense vessel.  No visible atherosclerosis. Skull: No skull fracture or other focal osseous abnormality involving the skull. Sinuses/Orbits: Visualized paranasal sinuses, bilateral mastoid air cells and bilateral middle ear cavities well-aerated. Visualized orbits and globes normal in appearance. Other: None. IMPRESSION: Normal examination. Electronically Signed   By: Hulan Saas M.D.   On: 10/30/2017 16:23    ____________________________________________    PROCEDURES  Procedure(s) performed:    Procedures    Medications  ondansetron (ZOFRAN-ODT) disintegrating tablet 4 mg (4 mg Oral Given 10/30/17 1608)  ibuprofen (ADVIL,MOTRIN) tablet 600 mg (600 mg Oral Given 10/30/17 1734)     ____________________________________________   INITIAL IMPRESSION / ASSESSMENT AND PLAN / ED COURSE  Pertinent labs & imaging results that were available during my care of the patient were reviewed by me and considered in my medical decision making (see chart for details).  Review of the [place] CSRS was performed in accordance of the [facility] prior to dispensing any controlled drugs.  Patient presented to the emergency department for evaluation after head injury. Vital signs and exam are reassuring. CT negative for acute abnormality. Nausea improved with zofran. Dose of ibuprofen was given. Patient will be discharged home with prescriptions for ibuprofen. Patient is to follow up with PCP as directed. Patient is given ED precautions to return to the ED for any worsening or new symptoms.     ____________________________________________  FINAL CLINICAL IMPRESSION(S) / ED DIAGNOSES  Final diagnoses:  Injury of head, initial encounter  Concussion without loss of consciousness, initial encounter      NEW MEDICATIONS STARTED DURING THIS VISIT:  ED Discharge Orders        Ordered    ibuprofen (ADVIL,MOTRIN) 600 MG  tablet  Every 6 hours PRN     10/30/17 1725          This chart was dictated using voice recognition software/Dragon. Despite best efforts to proofread, errors can occur which can change the meaning. Any change was purely unintentional.    Enid Derry, PA-C 10/30/17 1748    Don Perking, [place], MD 10/30/17 2019

## 2018-10-28 IMAGING — CT CT HEAD W/O CM
3 series · 15 of 46 positions shown, 18 images · non-contrast
Comparison: None.

CLINICAL DATA: 42-year-old who struck the back of her head on a
shelf at work earlier today, now with mild nausea. No loss of
consciousness. Initial encounter.

EXAM:
CT HEAD WITHOUT CONTRAST
TECHNIQUE: Contiguous axial images were obtained from the base of the skull
through the vertex without intravenous contrast.

[Series 2: head wo · axial · 0.47mm/px · z∈[-115,+5]mm · 9 of 29 slices shown, 12 images]
[im 3/29  brain]
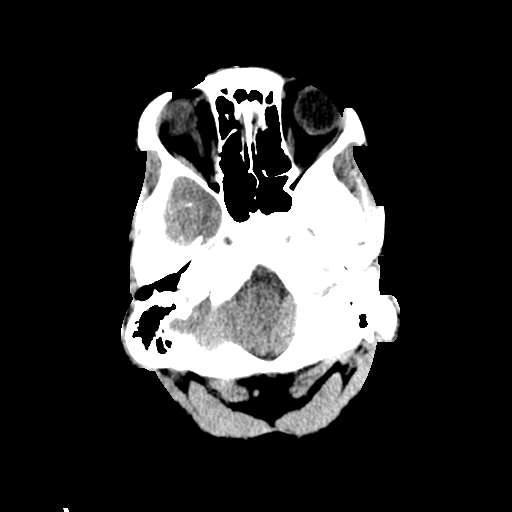
[im 3/29  bone]
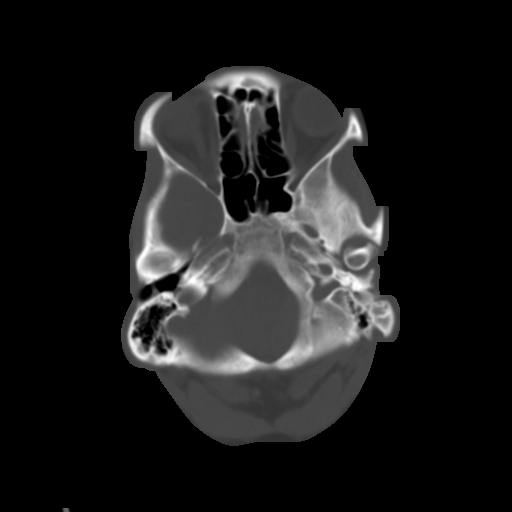
[im 6/29  brain]
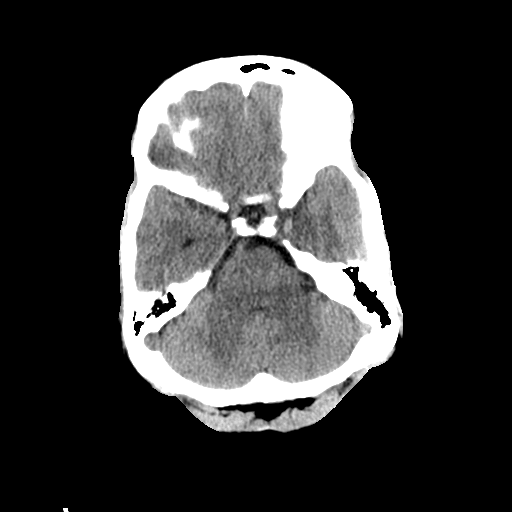
[im 9/29  brain]
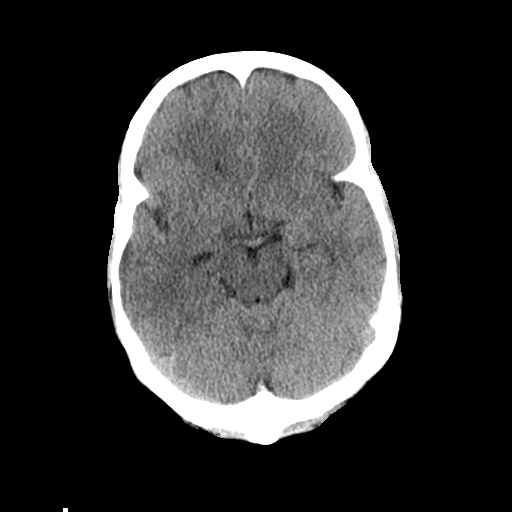
[im 12/29  brain]
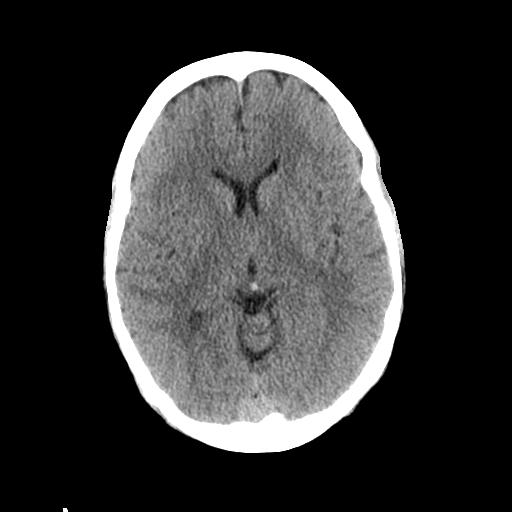
[im 15/29  brain]
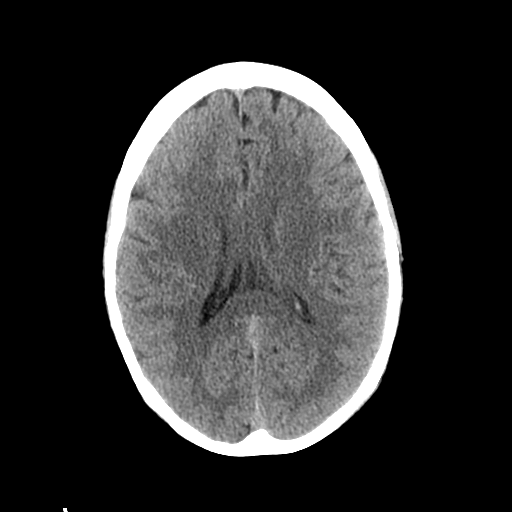
[im 15/29  bone]
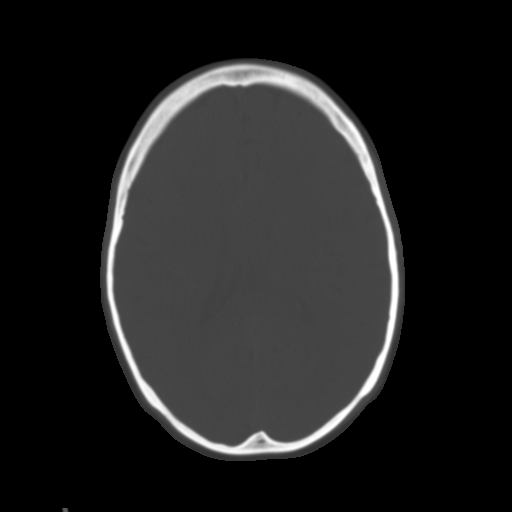
[im 18/29  brain]
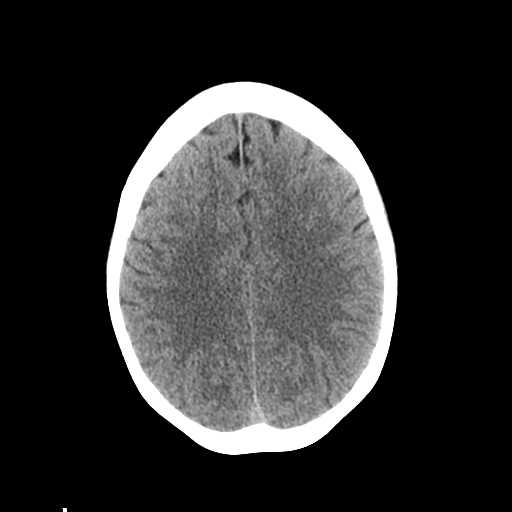
[im 21/29  brain]
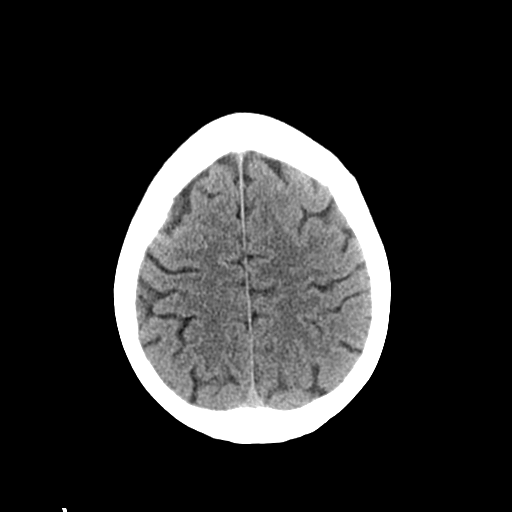
[im 24/29  brain]
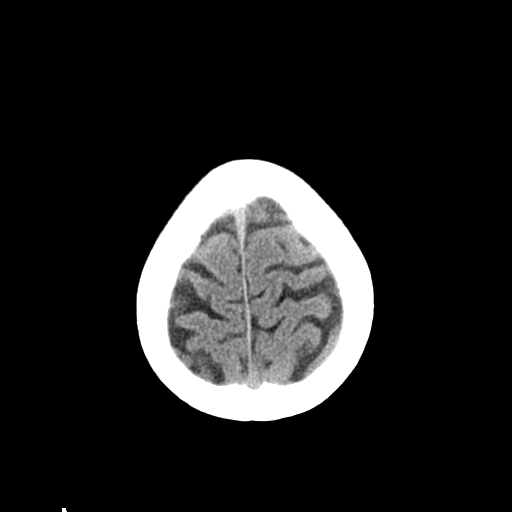
[im 27/29  brain]
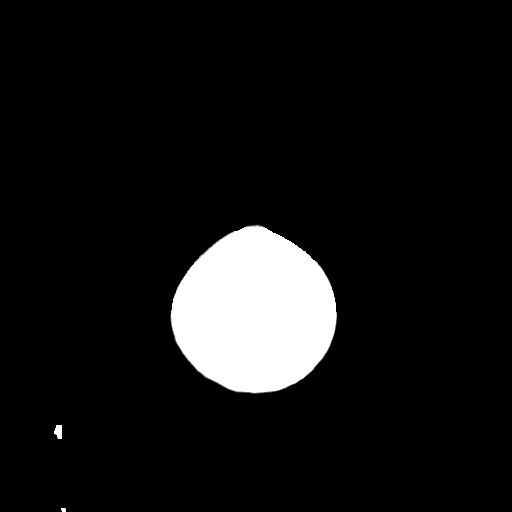
[im 27/29  bone]
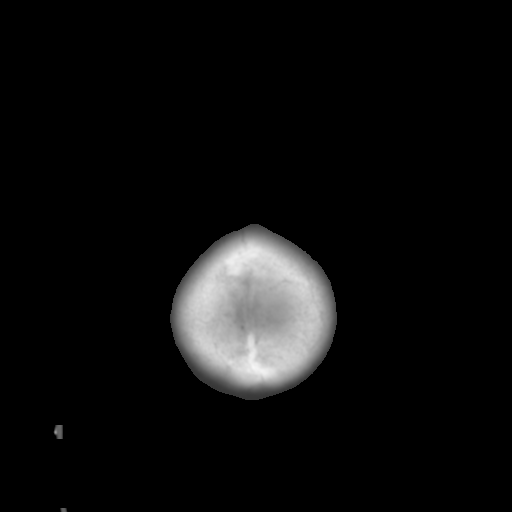

[Series 4: coronal soft tissue · coronal · 0.28mm/px · 3 of 65 slices shown]
[im 22/65  brain]
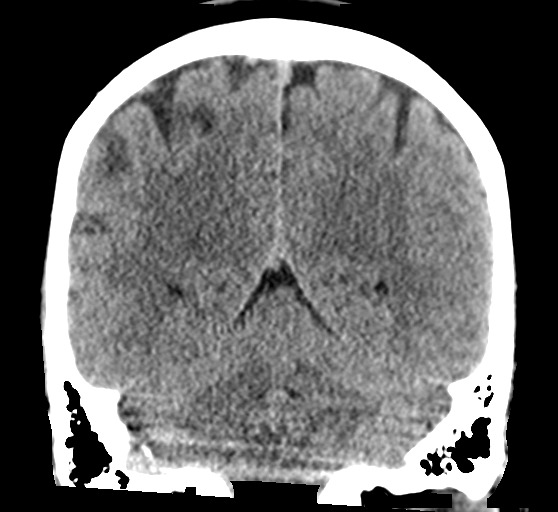
[im 29/65  brain]
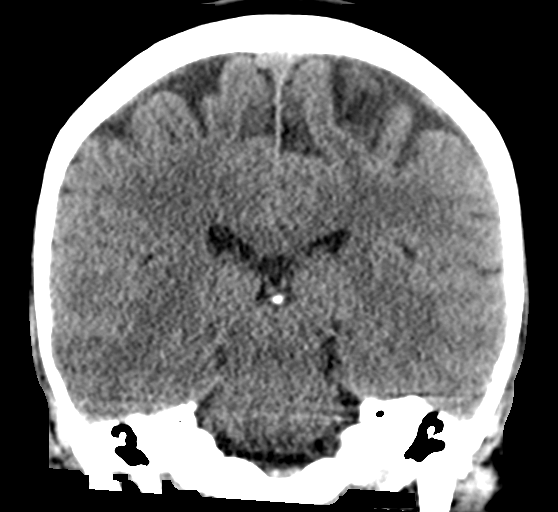
[im 36/65  brain]
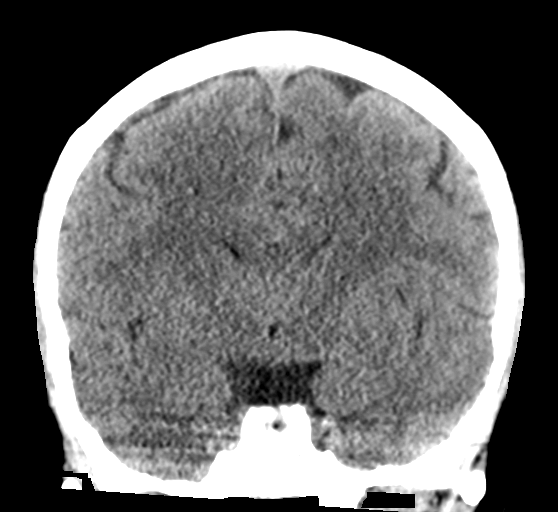

[Series 5: sagittal soft tissue · sagittal · 0.29mm/px · 3 of 49 slices shown]
[im 17/49  brain]
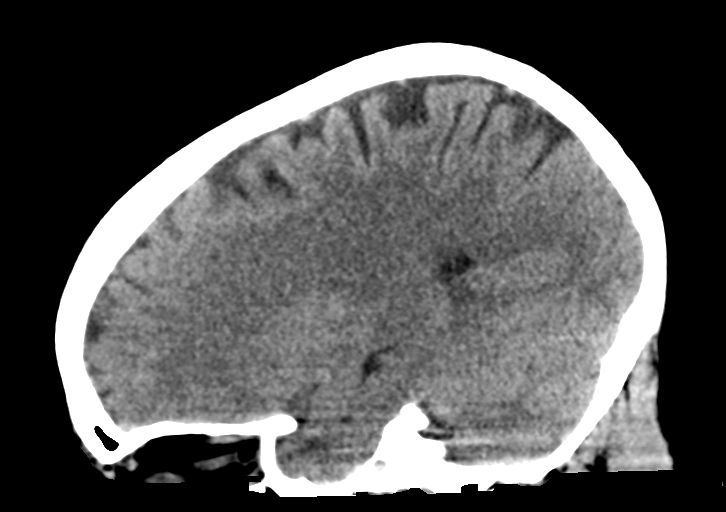
[im 25/49  brain]
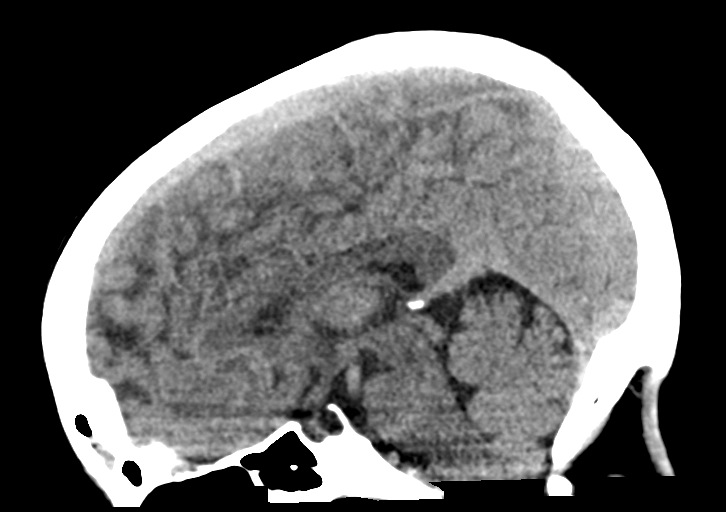
[im 33/49  brain]
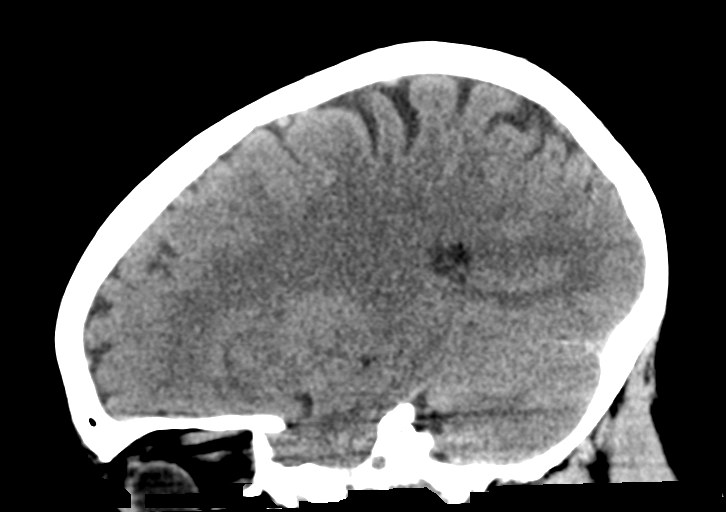

[15 of 46 positions shown; findings below may reference images not displayed]

FINDINGS: Brain: Ventricular system normal in size and appearance for age. No
mass lesion. No midline shift. No acute hemorrhage or hematoma. No
extra-axial fluid collections. No evidence of acute infarction. No
focal brain parenchymal abnormalities.

Vascular: No hyperdense vessel.  No visible atherosclerosis.

Skull: No skull fracture or other focal osseous abnormality
involving the skull.

Sinuses/Orbits: Visualized paranasal sinuses, bilateral mastoid air
cells and bilateral middle ear cavities well-aerated.

Visualized orbits and globes normal in appearance.

Other: None.
IMPRESSION: Normal examination.

## 2018-11-13 ENCOUNTER — Ambulatory Visit: Payer: 59 | Admitting: Internal Medicine

## 2018-11-15 ENCOUNTER — Other Ambulatory Visit: Payer: Self-pay

## 2018-11-15 ENCOUNTER — Ambulatory Visit: Payer: Self-pay | Admitting: Internal Medicine

## 2018-11-15 ENCOUNTER — Encounter: Payer: Self-pay | Admitting: Internal Medicine

## 2018-11-15 VITALS — BP 128/88 | HR 70 | Resp 12 | Ht 65.0 in | Wt 140.0 lb

## 2018-11-15 DIAGNOSIS — Z79899 Other long term (current) drug therapy: Secondary | ICD-10-CM

## 2018-11-15 DIAGNOSIS — J3089 Other allergic rhinitis: Secondary | ICD-10-CM

## 2018-11-15 DIAGNOSIS — I10 Essential (primary) hypertension: Secondary | ICD-10-CM

## 2018-11-15 DIAGNOSIS — J4540 Moderate persistent asthma, uncomplicated: Secondary | ICD-10-CM

## 2018-11-15 DIAGNOSIS — Z13220 Encounter for screening for lipoid disorders: Secondary | ICD-10-CM

## 2018-11-15 DIAGNOSIS — M5030 Other cervical disc degeneration, unspecified cervical region: Secondary | ICD-10-CM

## 2018-11-15 MED ORDER — MONTELUKAST SODIUM 10 MG PO TABS: 10.0000 mg | ORAL_TABLET | Freq: Every day | ORAL | 11 refills | Status: DC

## 2018-11-15 MED ORDER — HYDROCHLOROTHIAZIDE 25 MG PO TABS: 25.0000 mg | ORAL_TABLET | Freq: Every day | ORAL | 11 refills | Status: DC

## 2018-11-15 MED ORDER — CETIRIZINE HCL 10 MG PO TABS: ORAL_TABLET | ORAL | 11 refills | Status: DC

## 2018-11-15 MED ORDER — FLUTICASONE FUROATE-VILANTEROL 200-25 MCG/INH IN AEPB: 1.0000 | INHALATION_SPRAY | Freq: Every day | RESPIRATORY_TRACT | 11 refills | Status: DC

## 2018-11-15 MED ORDER — ALBUTEROL SULFATE HFA 108 (90 BASE) MCG/ACT IN AERS: 2.0000 | INHALATION_SPRAY | Freq: Four times a day (QID) | RESPIRATORY_TRACT | 2 refills | Status: DC | PRN

## 2018-11-15 MED ORDER — MOMETASONE FUROATE 50 MCG/ACT NA SUSP: NASAL | 11 refills | Status: DC

## 2018-11-15 NOTE — Patient Instructions (Addendum)
Use Albuterol only as needed. Use your Breo Ellipta every day before breakfast and brush teeth and tongue after breakfast. Obtain hypoallergenic covers for pillows and mattress and wipe down weekly when washing sheets and blankets.  PLEASE ASK YOUR MOTHER AND STEPFATHER TO SMOKE ONLY OUTSIDE, NOT IN HOME AS THE SMOKE GETS INTO FABRICS, INCLUDING CLOTHES AND HAIR AND TO ALL OTHER ROOMS AS CANNOT ADEQUATELY VENTILATE A HOME.   SMOKING INSIDE IS DETRIMENTAL TO ALL OF YOU AND YOUR BREATHING.  Drink a glass of water before every meal Drink 6-8 glasses of water daily Eat three meals daily Eat a protein and healthy fat with every meal (eggs,fish, chicken, [place] and limit red meats) Eat 5 servings of vegetables daily, mix the colors Eat 2 servings of fruit daily with skin, if skin is edible Use smaller plates Put food/utensils down as you chew and swallow each bite Eat at a table with friends/family at least once daily, no TV Do not eat in front of the TV  Recent studies show that people who consume all of their calories in a 12 hour period lose weight more efficiently.  For example, if you eat your first meal at 7:00 a.m., your last meal of the day should be completed by 7:00 p.m.

## 2018-11-15 NOTE — Progress Notes (Signed)
Subjective:    Patient ID: Autumn Lewis, female   DOB: 09-29-1975, 43 y.o.   MRN: [id number]   HPI   Here to establish.  1.  Asthma:  Diagnosed at age 37 yo.  History of smoking marijuana about 26 years and stopped when diagnosed with asthma.   Was originally treated in [place].   Currently taking Breo Ellipta, but not using regularly.   Using Albuterol HFA at least once daily--sometimes because she is just nervous at bedtime.   Montelukast 10 mg for at least 4 years.   Loratadine 10 mg daily.  2.   Allergies:  Eyes, throat and ears itch.  No watering but at times may be red.  Does have nasal congestion with some sneezing.  Was using Flonase, but stopped--did not feel she needed it.  Has year round allergies with Spring and Fall worsening.   She feels Loratadine and Montelukast work well for her.  She has never taken Zyrtec or Allegra.   Lots of tress around house.  Has dogs, cats, turtles and birds.  One dog sleeps in chair in her room.  Chair gets wiped down daily. Does not use hypoallergenic covers for pillows and mattress.    Tested in past:  Allergic to shellfish, tomatoes, tree pollen, soy, dust mites,  dog and cat dander among other things  2.  Hypertension:  Diagnosed at 43 yo.  HCTZ 25 mg daily. Walks daily and very active.   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  Only eats 2 servings of veggies daily.  Is anxious when eating fruit as all of it seems to cause her throat or mouth to itch, but she also states this might also be anxiety.    3.  History of cervical DDD, followed by Dr. Brennan Bailey last fall for this.  Was to go for injections, but was not approved.  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  Occurred 10/30/17  and coverage to have injection was not approved.  Her PT was helping, but was cut off.  Was never released from her physician care for this. Has pain from left neck and down lateral arm.      Current Meds  Medication Sig  . hydrochlorothiazide (HYDRODIURIL) 25 MG tablet Take 25 mg by mouth daily.  Marland Kitchen loratadine (CLARITIN) 10 MG tablet Take 10 mg by mouth daily.  . montelukast (SINGULAIR) 10 MG tablet Take 10 mg by mouth at bedtime.   Allergies  Allergen Reactions  . Strawberry Extract Anaphylaxis  . Milk-Related Compounds Diarrhea  . Shellfish Allergy Other (See Comments)    Was told not to eat after allergy tests   Past Medical History:  Diagnosis Date  . Asthma   . BV (bacterial vaginosis) 02/2017  . Hypertension   . Right ovarian cyst 12/09/2016    History reviewed. No pertinent surgical history.  Family History  Problem Relation Age of Onset  . Hypertension Father   . Heart disease Father        Had chest pain in day before he was found dead  . Diabetes Mellitus II Maternal Grandfather   . Stroke Mother 37  . Hypertension Mother   . Sleep apnea Mother     Social History   Socioeconomic History  . Marital status: Single    Spouse name: Not on file  . Number of children: 0  . Years of education: Not on file  .  Highest education level: 12th grade  Occupational History    Comment: Currently unemployed--living with mother  Social Needs  . Financial resource strain: Very hard  . Food insecurity:    Worry: Never true    Inability: Never true  . Transportation needs:    Medical: No    Non-medical: No  Tobacco Use  . Smoking status: Never Smoker  . Smokeless tobacco: Never Used  Substance and Sexual Activity  . Alcohol use: Yes    Comment: wine occasionally  . Drug use: Not Currently    Types: Marijuana    Comment: Only thing she smoked was MJ for 26 years, stopping with diagnosis of asthma at age 89  . Sexual activity: Not Currently    Birth control/protection: None  Lifestyle  . Physical activity:    Days per week: 7 days    Minutes per session: 60 min  . Stress: To some extent  Relationships  . Social connections:    Talks on phone: Not  on file    Gets together: Not on file    Attends religious service: Not on file    Active member of club or organization: Not on file    Attends meetings of clubs or organizations: Not on file    Relationship status: Not on file  . Intimate partner violence:    Fear of current or ex partner: No    Emotionally abused: No    Physically abused: No    Forced sexual activity: Not on file  Other Topics Concern  . Not on file  Social History Narrative   Moved to be with her mother and step father 2 years ago when her mother suffered a stroke.   She was in a temp job in 10/2017 and fell, hitting her head and has not been able to get back to work due to her neck injuries suffered at same time.     Review of Systems    Objective:   BP 128/88 (BP Location: Left Arm, Patient Position: Sitting, Cuff Size: Normal)   Pulse 70   Resp 12   Ht 5\' 5"  (1.651 m)   Wt 140 lb (63.5 kg)   LMP 10/22/2018 (Approximate)   PF 260 L/min   BMI 23.30 kg/m   Physical Exam  NAD HEENT:  PERRL, EOMI, conjunctivae without injection. TMs pearly gray.  Nasal mucosa boggy with clear discharge.  No pharyngeal erythema Neck: Supple, No adenopathy, no thyromegaly Chest:  Rare end expiratory wheeze CV:  RRR with normal S1 and S2, No S3, S4 or murmur.    Radial and DP pulses normal and equal LE:  No edema. MS:  Tender over cervical spinous processes, over left trap.   Neuro:  UE:  Motor 5/5, DTRs 2+/4 throughout.  Sensory to light touch grossly normal   Assessment & Plan   1.  Asthma:  Discussed need to control allergies to control asthma. Discussed maintenance inhaler and meds vs rescue inhaler usage. To use Breo Ellipta regularly and Albuterol only as needed. Continue Montelukast and allergy meds.  2.  Environmental and seasonal allergies:  Start Nasonex 2 sprays each nostril daily and Cetirizine 10 mg daily. Montelukast as above  3.  Hypertension:  HCTZ  4.  Cervical DDD:  Check old records.  PT  referral  CBC, CMP, FLP

## 2018-11-16 LAB — CBC WITH DIFFERENTIAL/PLATELET
Basophils Absolute: 0 10*3/uL (ref 0.0–0.2)
Basos: 1 %
EOS (ABSOLUTE): 0.2 10*3/uL (ref 0.0–0.4)
Eos: 2 %
Hematocrit: 39.3 % (ref 34.0–46.6)
Hemoglobin: 13.5 g/dL (ref 11.1–15.9)
Immature Grans (Abs): 0 10*3/uL (ref 0.0–0.1)
Immature Granulocytes: 0 %
Lymphocytes Absolute: 2 10*3/uL (ref 0.7–3.1)
Lymphs: 28 %
MCH: 31.5 pg (ref 26.6–33.0)
MCHC: 34.4 g/dL (ref 31.5–35.7)
MCV: 92 fL (ref 79–97)
Monocytes Absolute: 0.6 10*3/uL (ref 0.1–0.9)
Monocytes: 8 %
Neutrophils Absolute: 4.4 10*3/uL (ref 1.4–7.0)
Neutrophils: 61 %
Platelets: 202 10*3/uL (ref 150–450)
RBC: 4.29 x10E6/uL (ref 3.77–5.28)
RDW: 13.2 % (ref 11.7–15.4)
WBC: 7.1 10*3/uL (ref 3.4–10.8)

## 2018-11-16 LAB — LIPID PANEL W/O CHOL/HDL RATIO
Cholesterol, Total: 138 mg/dL (ref 100–199)
HDL: 49 mg/dL (ref >39)
LDL Calculated: 79 mg/dL (ref 0–99)
Triglycerides: 49 mg/dL (ref 0–149)
VLDL Cholesterol Cal: 10 mg/dL (ref 5–40)

## 2018-11-16 LAB — COMPREHENSIVE METABOLIC PANEL
ALT: 13 IU/L (ref 0–32)
AST: 14 IU/L (ref 0–40)
Albumin/Globulin Ratio: 1.9 (ref 1.2–2.2)
Albumin: 4.5 g/dL (ref 3.8–4.8)
Alkaline Phosphatase: 78 IU/L (ref 39–117)
BUN/Creatinine Ratio: 16 (ref 9–23)
BUN: 14 mg/dL (ref 6–24)
Bilirubin Total: 0.7 mg/dL (ref 0.0–1.2)
CO2: 24 mmol/L (ref 20–29)
Calcium: 9.2 mg/dL (ref 8.7–10.2)
Chloride: 102 mmol/L (ref 96–106)
Creatinine, Ser: 0.86 mg/dL (ref 0.57–1.00)
GFR calc Af Amer: 95 mL/min/{1.73_m2} (ref >59)
GFR calc non Af Amer: 82 mL/min/{1.73_m2} (ref >59)
Globulin, Total: 2.4 g/dL (ref 1.5–4.5)
Glucose: 81 mg/dL (ref 65–99)
Potassium: 4.1 mmol/L (ref 3.5–5.2)
Sodium: 140 mmol/L (ref 134–144)
Total Protein: 6.9 g/dL (ref 6.0–8.5)

## 2018-11-19 DIAGNOSIS — J3089 Other allergic rhinitis: Secondary | ICD-10-CM | POA: Insufficient documentation

## 2018-11-19 DIAGNOSIS — J4540 Moderate persistent asthma, uncomplicated: Secondary | ICD-10-CM | POA: Insufficient documentation

## 2018-11-19 DIAGNOSIS — I10 Essential (primary) hypertension: Secondary | ICD-10-CM | POA: Insufficient documentation

## 2018-11-19 DIAGNOSIS — M5030 Other cervical disc degeneration, unspecified cervical region: Secondary | ICD-10-CM

## 2018-11-19 HISTORY — DX: Moderate persistent asthma, uncomplicated: J45.40

## 2018-11-19 HISTORY — DX: Essential (primary) hypertension: I10

## 2018-11-19 HISTORY — DX: Other cervical disc degeneration, unspecified cervical region: M50.30

## 2019-02-15 ENCOUNTER — Ambulatory Visit: Payer: Medicaid Other | Admitting: Internal Medicine

## 2023-05-15 ENCOUNTER — Emergency Department
Admission: EM | Admit: 2023-05-15 | Discharge: 2023-05-15 | Disposition: A | Discharge status: Home / Self Care | Payer: Medicaid Other | Attending: Emergency Medicine | Admitting: Emergency Medicine

## 2023-05-15 ENCOUNTER — Emergency Department: Payer: Medicaid Other

## 2023-05-15 ENCOUNTER — Other Ambulatory Visit: Payer: Self-pay

## 2023-05-15 DIAGNOSIS — J45901 Unspecified asthma with (acute) exacerbation: Secondary | ICD-10-CM | POA: Diagnosis not present

## 2023-05-15 DIAGNOSIS — R062 Wheezing: Secondary | ICD-10-CM | POA: Diagnosis present

## 2023-05-15 LAB — CBC
HCT: 40.1 % (ref 36.0–46.0)
Hemoglobin: 13.4 g/dL (ref 12.0–15.0)
MCH: 30.4 pg (ref 26.0–34.0)
MCHC: 33.4 g/dL (ref 30.0–36.0)
MCV: 90.9 fL (ref 80.0–100.0)
Platelets: 177 10*3/uL (ref 150–400)
RBC: 4.41 MIL/uL (ref 3.87–5.11)
RDW: 14.6 % (ref 11.5–15.5)
WBC: 6.4 10*3/uL (ref 4.0–10.5)
nRBC: 0 % (ref 0.0–0.2)

## 2023-05-15 LAB — BASIC METABOLIC PANEL
Anion gap: 6 (ref 5–15)
BUN: 19 mg/dL (ref 6–20)
CO2: 27 mmol/L (ref 22–32)
Calcium: 8.6 mg/dL — ABNORMAL LOW (ref 8.9–10.3)
Chloride: 105 mmol/L (ref 98–111)
Creatinine, Ser: 0.96 mg/dL (ref 0.44–1.00)
GFR, Estimated: >60 mL/min (ref >60)
Glucose, Bld: 103 mg/dL — ABNORMAL HIGH (ref 70–99)
Potassium: 3.4 mmol/L — ABNORMAL LOW (ref 3.5–5.1)
Sodium: 138 mmol/L (ref 135–145)

## 2023-05-15 MED ORDER — PREDNISONE 50 MG PO TABS: 50.0000 mg | ORAL_TABLET | Freq: Every day | ORAL | 0 refills | Status: DC

## 2023-05-15 MED ORDER — IPRATROPIUM-ALBUTEROL 0.5-2.5 (3) MG/3ML IN SOLN
3.0000 mL | Freq: Once | RESPIRATORY_TRACT | Status: AC
  Administered 2023-05-15: 3 mL via RESPIRATORY_TRACT
  Filled 2023-05-15: qty 3

## 2023-05-15 MED ORDER — ALBUTEROL SULFATE HFA 108 (90 BASE) MCG/ACT IN AERS: 2.0000 | INHALATION_SPRAY | Freq: Four times a day (QID) | RESPIRATORY_TRACT | 2 refills | Status: DC | PRN

## 2023-05-15 NOTE — ED Notes (Signed)
Pt here via [facility] from home, EMS states resp "distress" over the past few weeks. EMS states wheezing noted.  1 albuterol TX 125 mg Solumedrol IV  18L AC  99% RA 176/112 HR: 70

## 2023-05-15 NOTE — ED Triage Notes (Signed)
Pt presents to ED with c/o of wheezing and SOB for a few weeks. Pt speaking in full sentences with no distress. Pt states some relief with TX given by EMS.

## 2023-05-15 NOTE — ED Provider Notes (Signed)
[facility] Provider Note    Event Date/Time   First MD Initiated Contact with Patient 05/15/23 [phone]     (approximate)   History   Asthma   HPI  Autumn Lewis is a 47 y.o. female with a history of asthma presents with complaints of wheezing, she reports this has been slowly worsening over the last couple of weeks.  Denies chest pain but does report tightness.  No fevers reported.  EMS gave 125 Solu-Medrol     Physical Exam   Triage Vital Signs: ED Triage Vitals  Encounter Vitals Group     BP 05/15/23 0919 (!) 163/100     Systolic BP Percentile --      Diastolic BP Percentile --      Pulse Rate 05/15/23 0919 82     Resp 05/15/23 0919 17     Temp 05/15/23 0919 98.6 F (37 C)     Temp Source 05/15/23 0919 Oral     SpO2 05/15/23 0919 97 %     Weight 05/15/23 0925 63.5 kg (139 lb 15.9 oz)     Height 05/15/23 0925 1.651 m (5\' 5" )     Head Circumference --      Peak Flow --      Pain Score 05/15/23 0925 0     Pain Loc --      Pain Education --      Exclude from Growth Chart --     Most recent vital signs: Vitals:   05/15/23 0919  BP: (!) 163/100  Pulse: 82  Resp: 17  Temp: 98.6 F (37 C)  SpO2: 97%     General: Awake, no distress.  CV:  Good peripheral perfusion.  Resp:  Normal effort.  Diffuse expiratory wheezing Abd:  No distention.  Other:     ED Results / Procedures / Treatments   Labs (all labs ordered are listed, but only abnormal results are displayed) Labs Reviewed  BASIC METABOLIC PANEL - Abnormal; Notable for the following components:      Result Value   Potassium 3.4 (*)    Glucose, Bld 103 (*)    Calcium 8.6 (*)    All other components within normal limits  CBC  POC URINE PREG, ED     EKG     RADIOLOGY Chest x-ray viewed interpreted by me, no infiltrate    PROCEDURES:  Critical Care performed:   Procedures   MEDICATIONS ORDERED IN ED: Medications  ipratropium-albuterol (DUONEB) 0.5-2.5 (3)  MG/3ML nebulizer solution 3 mL (3 mLs Nebulization Given 05/15/23 1011)  ipratropium-albuterol (DUONEB) 0.5-2.5 (3) MG/3ML nebulizer solution 3 mL (3 mLs Nebulization Given 05/15/23 1010)  ipratropium-albuterol (DUONEB) 0.5-2.5 (3) MG/3ML nebulizer solution 3 mL (3 mLs Nebulization Given 05/15/23 1154)     IMPRESSION / MDM / ASSESSMENT AND PLAN / ED COURSE  I reviewed the triage vital signs and the nursing notes. Patient's presentation is most consistent with severe exacerbation of chronic illness.  Patient presents with shortness of breath with diffuse wheezing as detailed above, likely related to asthma exacerbation, doubt pneumonia.  Chest x-ray viewed interpret by me, no evidence of infiltrate.  She has received IV Solu-Medrol, will give DuoNebs, obtain labs and reevaluate  Lab work reviewed and is overall unremarkable  Patient significantly improved after DuoNebs, wheezing has greatly improved, appropriate for discharge with prednisone burst, strict return precautions, patient agrees with this plan      FINAL CLINICAL IMPRESSION(S) / ED DIAGNOSES   Final diagnoses:  Moderate asthma with exacerbation, unspecified whether persistent     Rx / DC Orders   ED Discharge Orders          Ordered    predniSONE (DELTASONE) 50 MG tablet  Daily with breakfast        05/15/23 1142    albuterol (VENTOLIN HFA) 108 (90 Base) MCG/ACT inhaler  Every 6 hours PRN        05/15/23 1142             Note:  This document was prepared using Dragon voice recognition software and may include unintentional dictation errors.   Jene Every, MD 05/15/23 [phone]

## 2023-06-05 ENCOUNTER — Emergency Department
Admission: EM | Admit: 2023-06-05 | Discharge: 2023-06-05 | Disposition: A | Discharge status: Home / Self Care | Payer: Medicaid Other | Attending: Emergency Medicine | Admitting: Emergency Medicine

## 2023-06-05 ENCOUNTER — Other Ambulatory Visit: Payer: Self-pay

## 2023-06-05 ENCOUNTER — Emergency Department: Payer: Medicaid Other

## 2023-06-05 DIAGNOSIS — I10 Essential (primary) hypertension: Secondary | ICD-10-CM | POA: Insufficient documentation

## 2023-06-05 DIAGNOSIS — R0602 Shortness of breath: Secondary | ICD-10-CM | POA: Diagnosis present

## 2023-06-05 DIAGNOSIS — J45901 Unspecified asthma with (acute) exacerbation: Secondary | ICD-10-CM | POA: Diagnosis not present

## 2023-06-05 LAB — BASIC METABOLIC PANEL
Anion gap: 9 (ref 5–15)
BUN: 16 mg/dL (ref 6–20)
CO2: 22 mmol/L (ref 22–32)
Calcium: 8.9 mg/dL (ref 8.9–10.3)
Chloride: 106 mmol/L (ref 98–111)
Creatinine, Ser: 0.89 mg/dL (ref 0.44–1.00)
GFR, Estimated: >60 mL/min (ref >60)
Glucose, Bld: 105 mg/dL — ABNORMAL HIGH (ref 70–99)
Potassium: 4.3 mmol/L (ref 3.5–5.1)
Sodium: 137 mmol/L (ref 135–145)

## 2023-06-05 LAB — CBC
HCT: 42.1 % (ref 36.0–46.0)
Hemoglobin: 14 g/dL (ref 12.0–15.0)
MCH: 30.6 pg (ref 26.0–34.0)
MCHC: 33.3 g/dL (ref 30.0–36.0)
MCV: 92.1 fL (ref 80.0–100.0)
Platelets: 177 10*3/uL (ref 150–400)
RBC: 4.57 MIL/uL (ref 3.87–5.11)
RDW: 14.2 % (ref 11.5–15.5)
WBC: 6.9 10*3/uL (ref 4.0–10.5)
nRBC: 0 % (ref 0.0–0.2)

## 2023-06-05 MED ORDER — ALBUTEROL SULFATE HFA 108 (90 BASE) MCG/ACT IN AERS: 2.0000 | INHALATION_SPRAY | Freq: Four times a day (QID) | RESPIRATORY_TRACT | 2 refills | Status: DC | PRN

## 2023-06-05 MED ORDER — IPRATROPIUM-ALBUTEROL 0.5-2.5 (3) MG/3ML IN SOLN
3.0000 mL | Freq: Once | RESPIRATORY_TRACT | Status: AC
  Administered 2023-06-05: 3 mL via RESPIRATORY_TRACT
  Filled 2023-06-05: qty 3

## 2023-06-05 MED ORDER — AMLODIPINE BESYLATE 5 MG PO TABS: 5.0000 mg | ORAL_TABLET | Freq: Every day | ORAL | 11 refills | Status: DC

## 2023-06-05 MED ORDER — FLUTICASONE FUROATE-VILANTEROL 100-25 MCG/ACT IN AEPB: 1.0000 | INHALATION_SPRAY | Freq: Every day | RESPIRATORY_TRACT | 0 refills | Status: AC

## 2023-06-05 MED ORDER — PREDNISONE 20 MG PO TABS
60.0000 mg | ORAL_TABLET | Freq: Once | ORAL | Status: AC
  Administered 2023-06-05: 60 mg via ORAL
  Filled 2023-06-05: qty 3

## 2023-06-05 MED ORDER — PREDNISONE 10 MG PO TABS: ORAL_TABLET | ORAL | 0 refills | Status: AC

## 2023-06-05 MED ORDER — GUAIFENESIN ER 600 MG PO TB12: 600.0000 mg | ORAL_TABLET | Freq: Two times a day (BID) | ORAL | 2 refills | Status: DC

## 2023-06-05 NOTE — ED Triage Notes (Signed)
Pt comes with c/o dry cough and asthma. Pt stats sob. Pt stats this started three days ago. Pt doesn't have inhaler at home. Pt has some labored breathing noted.

## 2023-06-05 NOTE — ED Notes (Signed)
Pt presents to ED with c/o of being SOB, HX of asthma, pt does appear to be in some distress, pt denies being able to lay flat and is the room borderline tripoding. Pt states out of both steroid inhaler and rescue (albuterol) inhaler at home. Pt denies having breathing TX's at home.   Pt states seen recently for same and given prednisone and states minimal relief with taking them. Pt having some issues speaking in full sentences without needing to take a breath to finish.

## 2023-06-05 NOTE — ED Provider Notes (Addendum)
[facility] Provider Note    Event Date/Time   First MD Initiated Contact with Patient 06/05/23 [phone]     (approximate)   History   Shortness of Breath   HPI  Autumn Lewis is a 47 y.o. female with a history of asthma who presents with complaints of shortness of breath and wheezing which is worsened over the last 2 days.  No fevers reported, dry cough reported.     Physical Exam   Triage Vital Signs: ED Triage Vitals  Encounter Vitals Group     BP 06/05/23 0850 (!) 181/105     Systolic BP Percentile --      Diastolic BP Percentile --      Pulse Rate 06/05/23 0850 86     Resp 06/05/23 0850 (!) 21     Temp 06/05/23 0850 98 F (36.7 C)     Temp src --      SpO2 06/05/23 0848 95 %     Weight 06/05/23 0848 71.2 kg (157 lb)     Height 06/05/23 0848 1.651 m (5\' 5" )     Head Circumference --      Peak Flow --      Pain Score 06/05/23 0848 0     Pain Loc --      Pain Education --      Exclude from Growth Chart --     Most recent vital signs: Vitals:   06/05/23 0848 06/05/23 0850  BP:  (!) 181/105  Pulse:  86  Resp:  (!) 21  Temp:  98 F (36.7 C)  SpO2: 95% 95%     General: Awake, no distress.  CV:  Good peripheral perfusion.  Resp:  Increased respiratory effort with mild tachypnea, diffuse wheezing Abd:  No distention.  Other:     ED Results / Procedures / Treatments   Labs (all labs ordered are listed, but only abnormal results are displayed) Labs Reviewed  BASIC METABOLIC PANEL - Abnormal; Notable for the following components:      Result Value   Glucose, Bld 105 (*)    All other components within normal limits  CBC  POC URINE PREG, ED     EKG  ED ECG REPORT I, Jene Every, the attending physician, personally viewed and interpreted this ECG.  Date: 06/05/2023  Rhythm: normal sinus rhythm QRS Axis: normal Intervals: normal ST/T Wave abnormalities: normal Narrative Interpretation: no evidence of acute  ischemia    RADIOLOGY Chest x-ray viewed interpret by me, no acute abnormality    PROCEDURES:  Critical Care performed:   Procedures   MEDICATIONS ORDERED IN ED: Medications  ipratropium-albuterol (DUONEB) 0.5-2.5 (3) MG/3ML nebulizer solution 3 mL (3 mLs Nebulization Given 06/05/23 0939)  ipratropium-albuterol (DUONEB) 0.5-2.5 (3) MG/3ML nebulizer solution 3 mL (3 mLs Nebulization Given 06/05/23 0939)  predniSONE (DELTASONE) tablet 60 mg (60 mg Oral Given 06/05/23 0939)     IMPRESSION / MDM / ASSESSMENT AND PLAN / ED COURSE  I reviewed the triage vital signs and the nursing notes. Patient's presentation is most consistent with severe exacerbation of chronic illness.  Patient with a history of asthma presents with wheezing and shortness of breath as detailed above.  Differential includes asthma exacerbation, pneumonia, upper respiratory infection  Patient is afebrile, lab work reviewed and is reassuring, chest x-ray without evidence of pneumonia or pneumothorax  Treated with p.o. prednisone, multiple DuoNebs with significant improvement  On reevaluation, wheezing has resolved.  Appropriate for discharge at this time,  no indication for admission  Patient's blood pressure is uncontrolled, she admits she has not been taking her hydrochlorothiazide, will transition her to amlodipine, close PCP follow-up for titration as needed      FINAL CLINICAL IMPRESSION(S) / ED DIAGNOSES   Final diagnoses:  Moderate asthma with exacerbation, unspecified whether persistent     Rx / DC Orders   ED Discharge Orders          Ordered    amLODipine (NORVASC) 5 MG tablet  Daily        06/05/23 1059    predniSONE (DELTASONE) 10 MG tablet  Daily        06/05/23 1100    albuterol (VENTOLIN HFA) 108 (90 Base) MCG/ACT inhaler  Every 6 hours PRN        06/05/23 1100    guaiFENesin (MUCINEX) 600 MG 12 hr tablet  2 times daily        06/05/23 1118    fluticasone furoate-vilanterol (BREO  ELLIPTA) 100-25 MCG/ACT AEPB  Daily        06/05/23 1119             Note:  This document was prepared using Dragon voice recognition software and may include unintentional dictation errors.   Jene Every, MD 06/05/23 Harrold Donath    Jene Every, MD 06/05/23 [phone]

## 2023-07-06 ENCOUNTER — Other Ambulatory Visit: Payer: Self-pay

## 2023-07-06 ENCOUNTER — Emergency Department: Payer: Medicaid Other

## 2023-07-06 ENCOUNTER — Emergency Department
Admission: EM | Admit: 2023-07-06 | Discharge: 2023-07-06 | Disposition: A | Discharge status: Home / Self Care | Payer: Medicaid Other | Attending: Emergency Medicine | Admitting: Emergency Medicine

## 2023-07-06 DIAGNOSIS — J4541 Moderate persistent asthma with (acute) exacerbation: Secondary | ICD-10-CM | POA: Insufficient documentation

## 2023-07-06 DIAGNOSIS — I10 Essential (primary) hypertension: Secondary | ICD-10-CM | POA: Diagnosis not present

## 2023-07-06 DIAGNOSIS — Z79899 Other long term (current) drug therapy: Secondary | ICD-10-CM | POA: Diagnosis not present

## 2023-07-06 DIAGNOSIS — R0602 Shortness of breath: Secondary | ICD-10-CM | POA: Diagnosis not present

## 2023-07-06 LAB — CBC WITH DIFFERENTIAL/PLATELET
Abs Immature Granulocytes: 0.02 10*3/uL (ref 0.00–0.07)
Basophils Absolute: 0 10*3/uL (ref 0.0–0.1)
Basophils Relative: 0 %
Eosinophils Absolute: 0.3 10*3/uL (ref 0.0–0.5)
Eosinophils Relative: 3 %
HCT: 40.4 % (ref 36.0–46.0)
Hemoglobin: 13.6 g/dL (ref 12.0–15.0)
Immature Granulocytes: 0 %
Lymphocytes Relative: 15 %
Lymphs Abs: 1.3 10*3/uL (ref 0.7–4.0)
MCH: 30.5 pg (ref 26.0–34.0)
MCHC: 33.7 g/dL (ref 30.0–36.0)
MCV: 90.6 fL (ref 80.0–100.0)
Monocytes Absolute: 0.5 10*3/uL (ref 0.1–1.0)
Monocytes Relative: 6 %
Neutro Abs: 6.5 10*3/uL (ref 1.7–7.7)
Neutrophils Relative %: 76 %
Platelets: 180 10*3/uL (ref 150–400)
RBC: 4.46 MIL/uL (ref 3.87–5.11)
RDW: 13.7 % (ref 11.5–15.5)
WBC: 8.6 10*3/uL (ref 4.0–10.5)
nRBC: 0 % (ref 0.0–0.2)

## 2023-07-06 LAB — BASIC METABOLIC PANEL
Anion gap: 6 (ref 5–15)
BUN: 19 mg/dL (ref 6–20)
CO2: 25 mmol/L (ref 22–32)
Calcium: 9.1 mg/dL (ref 8.9–10.3)
Chloride: 106 mmol/L (ref 98–111)
Creatinine, Ser: 0.96 mg/dL (ref 0.44–1.00)
GFR, Estimated: >60 mL/min (ref >60)
Glucose, Bld: 108 mg/dL — ABNORMAL HIGH (ref 70–99)
Potassium: 3.8 mmol/L (ref 3.5–5.1)
Sodium: 137 mmol/L (ref 135–145)

## 2023-07-06 MED ORDER — ALBUTEROL SULFATE HFA 108 (90 BASE) MCG/ACT IN AERS: 2.0000 | INHALATION_SPRAY | Freq: Four times a day (QID) | RESPIRATORY_TRACT | 2 refills | Status: DC | PRN

## 2023-07-06 MED ORDER — FLUTICASONE FUROATE-VILANTEROL 100-25 MCG/ACT IN AEPB: 1.0000 | INHALATION_SPRAY | Freq: Every day | RESPIRATORY_TRACT | 2 refills | Status: DC

## 2023-07-06 MED ORDER — TRIAMCINOLONE ACETONIDE 0.1 % EX OINT: 1.0000 | TOPICAL_OINTMENT | Freq: Two times a day (BID) | CUTANEOUS | 1 refills | Status: DC

## 2023-07-06 MED ORDER — PREDNISONE 10 MG (21) PO TBPK: ORAL_TABLET | ORAL | 0 refills | Status: DC

## 2023-07-06 MED ORDER — ALBUTEROL SULFATE HFA 108 (90 BASE) MCG/ACT IN AERS: 2.0000 | INHALATION_SPRAY | RESPIRATORY_TRACT | Status: DC | PRN

## 2023-07-06 MED ORDER — IPRATROPIUM-ALBUTEROL 0.5-2.5 (3) MG/3ML IN SOLN
3.0000 mL | Freq: Once | RESPIRATORY_TRACT | Status: AC
  Administered 2023-07-06: 3 mL via RESPIRATORY_TRACT
  Filled 2023-07-06: qty 3

## 2023-07-06 MED ORDER — CETIRIZINE HCL 10 MG PO TABS: ORAL_TABLET | ORAL | 11 refills | Status: DC

## 2023-07-06 MED ORDER — MONTELUKAST SODIUM 10 MG PO TABS: 10.0000 mg | ORAL_TABLET | Freq: Every day | ORAL | 11 refills | Status: DC

## 2023-07-06 MED ORDER — BENZONATATE 100 MG PO CAPS: ORAL_CAPSULE | ORAL | 0 refills | Status: DC

## 2023-07-06 MED ORDER — MOMETASONE FUROATE 50 MCG/ACT NA SUSP: NASAL | 11 refills | Status: AC

## 2023-07-06 MED ORDER — AEROCHAMBER MV MISC: 0 refills | Status: AC

## 2023-07-06 MED ORDER — PREDNISONE 20 MG PO TABS
60.0000 mg | ORAL_TABLET | Freq: Once | ORAL | Status: AC
  Administered 2023-07-06: 60 mg via ORAL
  Filled 2023-07-06: qty 3

## 2023-07-06 MED ORDER — TRIAMCINOLONE ACETONIDE 0.1 % EX OINT: 1.0000 | TOPICAL_OINTMENT | Freq: Two times a day (BID) | CUTANEOUS | 1 refills | Status: AC

## 2023-07-06 MED ORDER — IPRATROPIUM-ALBUTEROL 0.5-2.5 (3) MG/3ML IN SOLN: 3.0000 mL | RESPIRATORY_TRACT | 1 refills | Status: DC | PRN

## 2023-07-06 NOTE — ED Triage Notes (Signed)
 Pt c/o SOB for the past few weeks with hx of asthma, pt denies CP. Pt reports hypertension and not taking her medication recently.

## 2023-07-06 NOTE — Discharge Instructions (Addendum)
 Take the prescription meds as directed.   Please go to the following website to schedule new (and existing) patient appointments:   [email or web address]   The following is a list of primary care offices in the area who are accepting new patients at this time.  Please reach out to one of them directly and let them know you would like to schedule an appointment to follow up on an Emergency Department visit, and/or to establish a new primary care provider (PCP).  There are likely other primary care clinics in the are who are accepting new patients, but this is an excellent place to start:  [facility] Lead physician: Dr Jon Eva [address] [place], [place] [address] [phone]  [facility] Lead Physician: Dr Dorette Loron [address], [place], [place] [address] [phone]  [facility]  Lead Physician: Dr Duwaine Louder [address] [place], [place] [address] [phone]  [facility] Lead Physician: Dr Marolyn Officer [address], [place], [place] [address] [phone]  [facility] Lead Physician: Dr Leita Adie [address] [place], [place], [place] [address] [phone]

## 2023-07-06 NOTE — ED Provider Notes (Signed)
 [facility] Emergency Department Provider Note     Event Date/Time   First MD Initiated Contact with Patient 07/06/23 1541     (approximate)   History   Shortness of Breath   HPI  Autumn Lewis is a 48 y.o. female with a history of asthma, HTN, and DDD, presents to the ED for evaluation of shortness of breath.  Patient would endorse intermittent symptoms over the last few weeks.  She does endorse a history of asthma.  She denies any frank chest pain, cough, or congestion.  Denies any fevers, chills, or sweats.  She would endorse poor compliance with her blood pressure medicine recently. She was previously on amlodipine  for HTN. She notes she is now on hydrochlorothiazide  25 mg and was on   Physical Exam   Triage Vital Signs: ED Triage Vitals [07/06/23 1313]  Encounter Vitals Group     BP      Systolic BP Percentile      Diastolic BP Percentile      Pulse Rate (!) 105     Resp 16     Temp 98.7 F (37.1 C)     Temp Source Oral     SpO2 95 %     Weight      Height 5' 5 (1.651 m)     Head Circumference      Peak Flow      Pain Score 0     Pain Loc      Pain Education      Exclude from Growth Chart     Most recent vital signs: Vitals:   07/06/23 1313 07/06/23 1615  BP:  (!) 155/122  Pulse: (!) 105   Resp: 16   Temp: 98.7 F (37.1 C)   SpO2: 95%     General Awake, no distress. NAD HEENT NCAT. PERRL. EOMI. No rhinorrhea. Mucous membranes are moist.  CV:  Good peripheral perfusion. RRR RESP:  Normal effort.  Expiratory wheeze noted bilaterally ABD:  No distention.   ED Results / Procedures / Treatments   Labs (all labs ordered are listed, but only abnormal results are displayed) Labs Reviewed  BASIC METABOLIC PANEL - Abnormal; Notable for the following components:      Result Value   Glucose, Bld 108 (*)    All other components within normal limits  CBC WITH DIFFERENTIAL/PLATELET     EKG  Vent. rate 79 BPM PR interval 140  ms QRS duration 74 ms QT/QTcB 324/371 ms P-R-T axes 76 -85 57 Normal sinus rhythm Left axis deviation Abnormal ECG When compared with ECG of 05-Jun-2023 08:51, Fusion complexes are no longer Present Premature ventricular complexes are no longer Present QT has shortened  RADIOLOGY  I personally viewed and evaluated these images as part of my medical decision making, as well as reviewing the written report by the radiologist.  ED Provider Interpretation: No acute findings  DG Chest 2 View Result Date: 07/06/2023 CLINICAL DATA:  Shortness of breath. EXAM: CHEST - 2 VIEW COMPARISON:  06/05/2023. FINDINGS: Bilateral lung fields are clear. Bilateral costophrenic angles are clear. Normal cardio-mediastinal silhouette. No acute osseous abnormalities. The soft tissues are within normal limits. IMPRESSION: No active cardiopulmonary disease. Electronically Signed   By: Ree Molt M.D.   On: 07/06/2023 15:04     PROCEDURES:  Critical Care performed: No  Procedures   MEDICATIONS ORDERED IN ED: Medications  ipratropium-albuterol  (DUONEB) 0.5-2.5 (3) MG/3ML nebulizer solution 3 mL (3 mLs Nebulization Given  07/06/23 1633)  predniSONE  (DELTASONE ) tablet 60 mg (60 mg Oral Given 07/06/23 1632)     IMPRESSION / MDM / ASSESSMENT AND PLAN / ED COURSE  I reviewed the triage vital signs and the nursing notes.                              Differential diagnosis includes, but is not limited to, viral syndrome, bronchitis including COPD exacerbation, pneumonia, reactive airway disease including asthma, CHF including exacerbation with or without pulmonary/interstitial edema, pneumothorax, ACS, thoracic trauma, and pulmonary embolism.   Patient's presentation is most consistent with acute complicated illness / injury requiring diagnostic workup.  Patient's diagnosis is consistent with likely asthma exacerbation.  No red flags on exam.  Patient without respiratory distress or dehydration.  Labs are  reassuring without any acute lab abnormalities.  X-ray does not reveal any acute infectious process, based on my interpretation.  Patient noted significant improvement of her symptoms following DuoNeb administration.  Patient will be discharged home with prescriptions for her maintenance medications and as well as prednisone , nebulizer machine, DuoNeb solution, and triamcinolone  ointment. Patient is to follow up with a local community clinic or select a new primary provider as discussed, as needed or otherwise directed. Patient is given ED precautions to return to the ED for any worsening or new symptoms.   FINAL CLINICAL IMPRESSION(S) / ED DIAGNOSES   Final diagnoses:  Moderate persistent asthma with exacerbation     Rx / DC Orders   ED Discharge Orders          Ordered    montelukast  (SINGULAIR ) 10 MG tablet  Daily at bedtime        07/06/23 1627    predniSONE  (STERAPRED UNI-PAK 21 TAB) 10 MG (21) TBPK tablet        07/06/23 1627    fluticasone  furoate-vilanterol (BREO ELLIPTA ) 100-25 MCG/ACT AEPB  Daily        07/06/23 1627    cetirizine  (ZYRTEC ) 10 MG tablet        07/06/23 1627    mometasone  (NASONEX ) 50 MCG/ACT nasal spray        07/06/23 1627    triamcinolone  ointment (KENALOG ) 0.1 %  2 times daily,   Status:  Discontinued        07/06/23 1627    triamcinolone  ointment (KENALOG ) 0.1 %  2 times daily        07/06/23 1658    albuterol  (VENTOLIN  HFA) 108 (90 Base) MCG/ACT inhaler  Every 6 hours PRN        07/06/23 1707    For home use only DME Nebulizer machine        07/06/23 1713    ipratropium-albuterol  (DUONEB) 0.5-2.5 (3) MG/3ML SOLN  Every 4 hours PRN        07/06/23 1713    Spacer/Aero-Holding Chambers (AEROCHAMBER MV) inhaler        07/06/23 1746    benzonatate  (TESSALON  PERLES) 100 MG capsule        07/06/23 1746             Note:  This document was prepared using Dragon voice recognition software and may include unintentional dictation errors.     Loyd Candida LULLA Aldona, PA-C 07/06/23 1748    Willo Dunnings, MD 07/06/23 [phone]

## 2023-07-20 ENCOUNTER — Emergency Department: Payer: Medicaid Other

## 2023-07-20 ENCOUNTER — Encounter: Payer: Self-pay | Admitting: Emergency Medicine

## 2023-07-20 ENCOUNTER — Inpatient Hospital Stay
Admission: EM | Admit: 2023-07-20 | Discharge: 2023-07-22 | DRG: 871 | Disposition: A | Discharge status: Home / Self Care | Payer: Medicaid Other | Attending: Internal Medicine | Admitting: Internal Medicine

## 2023-07-20 ENCOUNTER — Other Ambulatory Visit: Payer: Self-pay

## 2023-07-20 DIAGNOSIS — Z8249 Family history of ischemic heart disease and other diseases of the circulatory system: Secondary | ICD-10-CM

## 2023-07-20 DIAGNOSIS — J4541 Moderate persistent asthma with (acute) exacerbation: Secondary | ICD-10-CM | POA: Diagnosis not present

## 2023-07-20 DIAGNOSIS — Z823 Family history of stroke: Secondary | ICD-10-CM

## 2023-07-20 DIAGNOSIS — Z833 Family history of diabetes mellitus: Secondary | ICD-10-CM

## 2023-07-20 DIAGNOSIS — A4189 Other specified sepsis: Principal | ICD-10-CM | POA: Diagnosis present

## 2023-07-20 DIAGNOSIS — R509 Fever, unspecified: Secondary | ICD-10-CM | POA: Diagnosis present

## 2023-07-20 DIAGNOSIS — Z91014 Allergy to mammalian meats: Secondary | ICD-10-CM

## 2023-07-20 DIAGNOSIS — J1001 Influenza due to other identified influenza virus with the same other identified influenza virus pneumonia: Secondary | ICD-10-CM | POA: Diagnosis present

## 2023-07-20 DIAGNOSIS — J4540 Moderate persistent asthma, uncomplicated: Secondary | ICD-10-CM | POA: Diagnosis present

## 2023-07-20 DIAGNOSIS — J111 Influenza due to unidentified influenza virus with other respiratory manifestations: Principal | ICD-10-CM

## 2023-07-20 DIAGNOSIS — R0602 Shortness of breath: Secondary | ICD-10-CM | POA: Diagnosis not present

## 2023-07-20 DIAGNOSIS — R5081 Fever presenting with conditions classified elsewhere: Secondary | ICD-10-CM | POA: Diagnosis not present

## 2023-07-20 DIAGNOSIS — J9601 Acute respiratory failure with hypoxia: Secondary | ICD-10-CM | POA: Diagnosis present

## 2023-07-20 DIAGNOSIS — Z91013 Allergy to seafood: Secondary | ICD-10-CM

## 2023-07-20 DIAGNOSIS — Z1152 Encounter for screening for COVID-19: Secondary | ICD-10-CM

## 2023-07-20 DIAGNOSIS — J45909 Unspecified asthma, uncomplicated: Secondary | ICD-10-CM

## 2023-07-20 DIAGNOSIS — Z9109 Other allergy status, other than to drugs and biological substances: Secondary | ICD-10-CM

## 2023-07-20 DIAGNOSIS — J09X1 Influenza due to identified novel influenza A virus with pneumonia: Secondary | ICD-10-CM | POA: Insufficient documentation

## 2023-07-20 DIAGNOSIS — J440 Chronic obstructive pulmonary disease with acute lower respiratory infection: Secondary | ICD-10-CM | POA: Diagnosis present

## 2023-07-20 DIAGNOSIS — J441 Chronic obstructive pulmonary disease with (acute) exacerbation: Secondary | ICD-10-CM | POA: Diagnosis present

## 2023-07-20 DIAGNOSIS — J101 Influenza due to other identified influenza virus with other respiratory manifestations: Secondary | ICD-10-CM | POA: Diagnosis present

## 2023-07-20 DIAGNOSIS — R059 Cough, unspecified: Secondary | ICD-10-CM | POA: Diagnosis not present

## 2023-07-20 DIAGNOSIS — J45901 Unspecified asthma with (acute) exacerbation: Secondary | ICD-10-CM | POA: Diagnosis present

## 2023-07-20 DIAGNOSIS — Z91018 Allergy to other foods: Secondary | ICD-10-CM

## 2023-07-20 DIAGNOSIS — Z7951 Long term (current) use of inhaled steroids: Secondary | ICD-10-CM

## 2023-07-20 DIAGNOSIS — I10 Essential (primary) hypertension: Secondary | ICD-10-CM | POA: Diagnosis present

## 2023-07-20 DIAGNOSIS — Z91011 Allergy to milk products: Secondary | ICD-10-CM

## 2023-07-20 DIAGNOSIS — Z79899 Other long term (current) drug therapy: Secondary | ICD-10-CM

## 2023-07-20 HISTORY — DX: Unspecified asthma, uncomplicated: J45.909

## 2023-07-20 HISTORY — DX: Influenza due to identified novel influenza A virus with pneumonia: J09.X1

## 2023-07-20 LAB — RESP PANEL BY RT-PCR (RSV, FLU A&B, COVID)  RVPGX2
Influenza A by PCR: POSITIVE — AB
Influenza B by PCR: NEGATIVE
Resp Syncytial Virus by PCR: NEGATIVE
SARS Coronavirus 2 by RT PCR: NEGATIVE

## 2023-07-20 LAB — BASIC METABOLIC PANEL
Anion gap: 10 (ref 5–15)
BUN: 13 mg/dL (ref 6–20)
CO2: 24 mmol/L (ref 22–32)
Calcium: 8.9 mg/dL (ref 8.9–10.3)
Chloride: 98 mmol/L (ref 98–111)
Creatinine, Ser: 0.89 mg/dL (ref 0.44–1.00)
GFR, Estimated: >60 mL/min (ref >60)
Glucose, Bld: 142 mg/dL — ABNORMAL HIGH (ref 70–99)
Potassium: 3.7 mmol/L (ref 3.5–5.1)
Sodium: 132 mmol/L — ABNORMAL LOW (ref 135–145)

## 2023-07-20 LAB — CBC
HCT: 42.5 % (ref 36.0–46.0)
Hemoglobin: 14.1 g/dL (ref 12.0–15.0)
MCH: 30.3 pg (ref 26.0–34.0)
MCHC: 33.2 g/dL (ref 30.0–36.0)
MCV: 91.4 fL (ref 80.0–100.0)
Platelets: 152 10*3/uL (ref 150–400)
RBC: 4.65 MIL/uL (ref 3.87–5.11)
RDW: 13.3 % (ref 11.5–15.5)
WBC: 7 10*3/uL (ref 4.0–10.5)
nRBC: 0 % (ref 0.0–0.2)

## 2023-07-20 LAB — TROPONIN I (HIGH SENSITIVITY): Troponin I (High Sensitivity): 6 ng/L (ref <18)

## 2023-07-20 LAB — HIV ANTIBODY (ROUTINE TESTING W REFLEX): HIV Screen 4th Generation wRfx: NONREACTIVE

## 2023-07-20 MED ORDER — FLUTICASONE PROPIONATE 50 MCG/ACT NA SUSP
1.0000 | Freq: Every day | NASAL | Status: DC
  Administered 2023-07-20 – 2023-07-22 (×3): 1 via NASAL
  Filled 2023-07-20: qty 16

## 2023-07-20 MED ORDER — ACETAMINOPHEN 325 MG PO TABS
650.0000 mg | ORAL_TABLET | Freq: Four times a day (QID) | ORAL | Status: DC | PRN
  Administered 2023-07-20 – 2023-07-21 (×2): 650 mg via ORAL
  Filled 2023-07-20 (×2): qty 2

## 2023-07-20 MED ORDER — ACETAMINOPHEN 325 MG PO TABS
650.0000 mg | ORAL_TABLET | Freq: Once | ORAL | Status: AC | PRN
  Administered 2023-07-20: 650 mg via ORAL
  Filled 2023-07-20: qty 2

## 2023-07-20 MED ORDER — PREDNISONE 20 MG PO TABS
40.0000 mg | ORAL_TABLET | Freq: Every day | ORAL | Status: DC
  Administered 2023-07-20 – 2023-07-21 (×2): 40 mg via ORAL
  Filled 2023-07-20 (×2): qty 2

## 2023-07-20 MED ORDER — MONTELUKAST SODIUM 10 MG PO TABS
10.0000 mg | ORAL_TABLET | Freq: Every day | ORAL | Status: DC
  Administered 2023-07-20 – 2023-07-21 (×2): 10 mg via ORAL
  Filled 2023-07-20 (×2): qty 1

## 2023-07-20 MED ORDER — IPRATROPIUM-ALBUTEROL 0.5-2.5 (3) MG/3ML IN SOLN
3.0000 mL | Freq: Once | RESPIRATORY_TRACT | Status: AC
Start: 2023-07-20 — End: 2023-07-20
  Administered 2023-07-20: 3 mL via RESPIRATORY_TRACT
  Filled 2023-07-20: qty 3

## 2023-07-20 MED ORDER — BENZONATATE 100 MG PO CAPS
100.0000 mg | ORAL_CAPSULE | Freq: Three times a day (TID) | ORAL | Status: DC | PRN
  Administered 2023-07-21: 100 mg via ORAL
  Filled 2023-07-20: qty 1

## 2023-07-20 MED ORDER — IPRATROPIUM-ALBUTEROL 0.5-2.5 (3) MG/3ML IN SOLN
3.0000 mL | Freq: Four times a day (QID) | RESPIRATORY_TRACT | Status: DC
  Administered 2023-07-20 (×3): 3 mL via RESPIRATORY_TRACT
  Filled 2023-07-20 (×3): qty 3

## 2023-07-20 MED ORDER — ACETAMINOPHEN 650 MG RE SUPP: 650.0000 mg | Freq: Four times a day (QID) | RECTAL | Status: DC | PRN

## 2023-07-20 MED ORDER — GUAIFENESIN ER 600 MG PO TB12
600.0000 mg | ORAL_TABLET | Freq: Two times a day (BID) | ORAL | Status: DC
Start: 2023-07-20 — End: 2023-07-22
  Administered 2023-07-21 – 2023-07-22 (×3): 600 mg via ORAL
  Filled 2023-07-20 (×5): qty 1

## 2023-07-20 MED ORDER — LORATADINE 10 MG PO TABS
10.0000 mg | ORAL_TABLET | Freq: Every day | ORAL | Status: DC
Start: 2023-07-20 — End: 2023-07-22
  Administered 2023-07-20 – 2023-07-22 (×3): 10 mg via ORAL
  Filled 2023-07-20 (×3): qty 1

## 2023-07-20 MED ORDER — ALBUTEROL SULFATE (2.5 MG/3ML) 0.083% IN NEBU
2.5000 mg | INHALATION_SOLUTION | Freq: Four times a day (QID) | RESPIRATORY_TRACT | Status: DC | PRN
Start: 2023-07-20 — End: 2023-07-22

## 2023-07-20 MED ORDER — AMLODIPINE BESYLATE 5 MG PO TABS
5.0000 mg | ORAL_TABLET | Freq: Every day | ORAL | Status: DC
  Administered 2023-07-20 – 2023-07-22 (×3): 5 mg via ORAL
  Filled 2023-07-20 (×3): qty 1

## 2023-07-20 MED ORDER — ONDANSETRON HCL 4 MG PO TABS: 4.0000 mg | ORAL_TABLET | Freq: Four times a day (QID) | ORAL | Status: DC | PRN

## 2023-07-20 MED ORDER — OSELTAMIVIR PHOSPHATE 75 MG PO CAPS
75.0000 mg | ORAL_CAPSULE | Freq: Two times a day (BID) | ORAL | Status: AC
  Administered 2023-07-20 – 2023-07-22 (×5): 75 mg via ORAL
  Filled 2023-07-20 (×5): qty 1

## 2023-07-20 MED ORDER — IPRATROPIUM-ALBUTEROL 0.5-2.5 (3) MG/3ML IN SOLN
3.0000 mL | Freq: Once | RESPIRATORY_TRACT | Status: AC
  Administered 2023-07-20: 3 mL via RESPIRATORY_TRACT
  Filled 2023-07-20: qty 3

## 2023-07-20 MED ORDER — METHYLPREDNISOLONE SODIUM SUCC 125 MG IJ SOLR
125.0000 mg | Freq: Once | INTRAMUSCULAR | Status: AC
  Administered 2023-07-20: 125 mg via INTRAVENOUS
  Filled 2023-07-20: qty 2

## 2023-07-20 MED ORDER — FLUTICASONE FUROATE-VILANTEROL 100-25 MCG/ACT IN AEPB
1.0000 | INHALATION_SPRAY | Freq: Every day | RESPIRATORY_TRACT | Status: DC
  Administered 2023-07-20 – 2023-07-22 (×3): 1 via RESPIRATORY_TRACT
  Filled 2023-07-20: qty 28

## 2023-07-20 MED ORDER — ONDANSETRON HCL 4 MG/2ML IJ SOLN: 4.0000 mg | Freq: Four times a day (QID) | INTRAMUSCULAR | Status: DC | PRN

## 2023-07-20 NOTE — ED Notes (Signed)
See triage note  States her asthma as been getting worse over the past couple days  Febrile on arrival

## 2023-07-20 NOTE — ED Notes (Signed)
See triage note  Presents with some SOB and cough  States this started a few days ago  Using inhaler with min relief  Presents with fever this am  States her cough is productive   clear phlegm

## 2023-07-20 NOTE — ED Notes (Signed)
Up to the bathroom  Ambulates well

## 2023-07-20 NOTE — ED Provider Notes (Signed)
[facility] Provider Note    Event Date/Time   First MD Initiated Contact with Patient 07/20/23 1005     (approximate)   History   Shortness of Breath   HPI  Autumn Lewis is a 48 y.o. female with hypertension, asthma who comes in with concerns for shortness of breath.  Patient was 87% on room air and placed on 2 L nasal cannula.  She reports having worsening shortness of breath and feeling like her asthma was flaring up.  Denied any known fever.   Physical Exam   Triage Vital Signs: ED Triage Vitals  Encounter Vitals Group     BP 07/20/23 0916 (!) 160/103     Systolic BP Percentile --      Diastolic BP Percentile --      Pulse Rate 07/20/23 0916 (!) 107     Resp 07/20/23 0916 20     Temp 07/20/23 0918 (!) 101 F (38.3 C)     Temp Source 07/20/23 0918 Oral     SpO2 07/20/23 0916 (!) 87 %     Weight 07/20/23 0914 150 lb (68 kg)     Height 07/20/23 0914 5\' 5"  (1.651 m)     Head Circumference --      Peak Flow --      Pain Score 07/20/23 0914 5     Pain Loc --      Pain Education --      Exclude from Growth Chart --     Most recent vital signs: Vitals:   07/20/23 0918 07/20/23 0922  BP:    Pulse:    Resp:    Temp: (!) 101 F (38.3 C)   SpO2:  93%     General: Awake, no distress.  CV:  Good peripheral perfusion.  Resp:  Normal effort.  Abd:  No distention.  Other:  + wheezing noted    ED Results / Procedures / Treatments   Labs (all labs ordered are listed, but only abnormal results are displayed) Labs Reviewed  BASIC METABOLIC PANEL - Abnormal; Notable for the following components:      Result Value   Sodium 132 (*)    Glucose, Bld 142 (*)    All other components within normal limits  RESP PANEL BY RT-PCR (RSV, FLU A&B, COVID)  RVPGX2  CBC  POC URINE PREG, ED     EKG  My interpretation of EKG:  Sinus tachycardia with a rate of 104 without any ST elevation, possible T wave inversions in the inferior leads, normal  intervals.  Similar T wave on prior EKG  RADIOLOGY I have reviewed the xray personally and interpreted and no evidence of any pneumonia  PROCEDURES:  Critical Care performed: Yes, see critical care procedure note(s)  .Critical Care  Performed by: Concha Se, MD Authorized by: Concha Se, MD   Critical care provider statement:    Critical care time (minutes):  30   Critical care was necessary to treat or prevent imminent or life-threatening deterioration of the following conditions:  Respiratory failure   Critical care was time spent personally by me on the following activities:  Development of treatment plan with patient or surrogate, discussions with consultants, evaluation of patient's response to treatment, examination of patient, ordering and review of laboratory studies, ordering and review of radiographic studies, ordering and performing treatments and interventions, pulse oximetry, re-evaluation of patient's condition and review of old charts    MEDICATIONS ORDERED IN ED: Medications  acetaminophen (TYLENOL) tablet 650 mg (650 mg Oral Given 07/20/23 3086)     IMPRESSION / MDM / ASSESSMENT AND PLAN / ED COURSE  I reviewed the triage vital signs and the nursing notes.   Patient's presentation is most consistent with acute presentation with potential threat to life or bodily function.   Patient comes in with hypoxia no significant increased work of breathing.  Differential includes pneumonia, COVID, flu.  Could be from her asthma given bilateral wheezing.  CBC shows normal white count.  BMP is reassuring.  Troponin was negative  Patient is flu positive.  Given patient is hypoxic I recommend admission for Tamiflu, oxygen.  Did treat for possible asthma exacerbation with DuoNebs, steroids.  I have hold off on calling a sepsis alert given no pneumonia on x-ray and this is consistent with viral illness therefore blood cultures, lactate were not ordered.  I did discuss this  with the hospitalist who is agreeable and have admitted patient to them.     FINAL CLINICAL IMPRESSION(S) / ED DIAGNOSES   Final diagnoses:  Influenza  Acute respiratory failure with hypoxia (HCC)     Rx / DC Orders   ED Discharge Orders     None        Note:  This document was prepared using Dragon voice recognition software and may include unintentional dictation errors.   Concha Se, MD 07/20/23 [phone]

## 2023-07-20 NOTE — H&P (Signed)
History and Physical    Autumn Lewis EXB:[id number] DOB: 17-Dec-1975 DOA: 07/20/2023  PCP: Pcp, No (Confirm with patient/family/NH records and if not entered, this has to be entered at [facility] point of entry) Patient coming from: Home  I have personally briefly reviewed patient's old medical records in [facility]  Chief Complaint: Cough, wheezing, SOB, fever  HPI: Autumn Lewis is a 48 y.o. female with medical history significant of moderate asthma, HTN, presented with worsening of cough wheezing shortness of breath and new onset of fever.  Symptoms started 2 days ago with URI-like symptoms with nasal congestion and runny nose, soon she started develop dry cough, wheezing and increasing exertional dyspnea.  She been using her albuterol around-the-clock with some help.  This morning she started to develop fever 101 at home and decided to come to the hospital.  She has had no influenza vaccination this year.  ED Course: Temperature 101, tachycardia blood pressure elevated 160/100, O2 saturation 87% on room air.  Stabilized on 2 L with O2 saturation improved to 93%.  Chest x-ray showed no acute infiltrates.  Influenza A positive, COVID-negative.  WBC 7.0, hemoglobin 14.1, creatinine 2.8 bicarb 24.  Patient was given IV Solu-Medrol and breathing treatment x 2 but continued to wheeze and requiring O2 support.  Review of Systems: As per HPI otherwise 14 point review of systems negative.    Past Medical History:  Diagnosis Date   Asthma    BV (bacterial vaginosis) 02/2017   Hypertension    Right ovarian cyst 12/09/2016    History reviewed. No pertinent surgical history.   reports that she has never smoked. She has never used smokeless tobacco. She reports current alcohol use. She reports that she does not currently use drugs after having used the following drugs: Marijuana.  Allergies  Allergen Reactions   Strawberry Extract Anaphylaxis   Milk-Related Compounds Diarrhea   Pork-Derived  Products Hives   Shellfish Allergy Other (See Comments)    Was told not to eat after allergy tests   Tomato    Tree Extract Other (See Comments)    Pt states unknown reation    Family History  Problem Relation Age of Onset   Hypertension Father    Heart disease Father        Had chest pain in day before he was found dead   Diabetes Mellitus II Maternal Grandfather    Stroke Mother 54   Hypertension Mother    Sleep apnea Mother      Prior to Admission medications   Medication Sig Start Date End Date Taking? Authorizing Provider  albuterol (VENTOLIN HFA) 108 (90 Base) MCG/ACT inhaler Inhale 2 puffs into the lungs every 6 (six) hours as needed for wheezing or shortness of breath. 07/06/23   Menshew, Charlesetta Ivory, PA-C  amLODipine (NORVASC) 5 MG tablet Take 1 tablet (5 mg total) by mouth daily. 06/05/23 06/04/24  Jene Every, MD  benzonatate (TESSALON PERLES) 100 MG capsule Take 1-2 tabs TID prn cough 07/06/23   Menshew, Charlesetta Ivory, PA-C  cetirizine (ZYRTEC) 10 MG tablet 1 tab by mouth daily as needed 07/06/23   Menshew, Charlesetta Ivory, PA-C  fluticasone furoate-vilanterol (BREO ELLIPTA) 100-25 MCG/ACT AEPB Inhale 1 puff into the lungs daily. 07/06/23   Menshew, Charlesetta Ivory, PA-C  guaiFENesin (MUCINEX) 600 MG 12 hr tablet Take 1 tablet (600 mg total) by mouth 2 (two) times daily. 06/05/23 06/04/24  Jene Every, MD  ipratropium-albuterol (DUONEB) 0.5-2.5 (3) MG/3ML SOLN  Take 3 mLs by nebulization every 4 (four) hours as needed. 07/06/23   Menshew, Charlesetta Ivory, PA-C  mometasone (NASONEX) 50 MCG/ACT nasal spray 2 sprays each nostril daily. 07/06/23   Menshew, Charlesetta Ivory, PA-C  montelukast (SINGULAIR) 10 MG tablet Take 1 tablet (10 mg total) by mouth at bedtime. 07/06/23   Menshew, Charlesetta Ivory, PA-C  predniSONE (STERAPRED UNI-PAK 21 TAB) 10 MG (21) TBPK tablet 7-day taper per packet instructions 07/06/23   Menshew, Charlesetta Ivory, PA-C  Spacer/Aero-Holding Chambers (AEROCHAMBER MV)  inhaler Use as instructed 07/06/23   Menshew, Charlesetta Ivory, PA-C  triamcinolone ointment (KENALOG) 0.1 % Apply 1 Application topically 2 (two) times daily. 07/06/23   Lissa Hoard, PA-C    Physical Exam: Vitals:   07/20/23 0914 07/20/23 0916 07/20/23 0918 07/20/23 0922  BP:  (!) 160/103    Pulse:  (!) 107    Resp:  20    Temp:   (!) 101 F (38.3 C)   TempSrc:   Oral   SpO2:  (!) 87%  93%  Weight: 68 kg     Height: 5\' 5"  (1.651 m)       Constitutional: NAD, calm, comfortable Vitals:   07/20/23 0914 07/20/23 0916 07/20/23 0918 07/20/23 0922  BP:  (!) 160/103    Pulse:  (!) 107    Resp:  20    Temp:   (!) 101 F (38.3 C)   TempSrc:   Oral   SpO2:  (!) 87%  93%  Weight: 68 kg     Height: 5\' 5"  (1.651 m)      Eyes: PERRL, lids and conjunctivae normal ENMT: Mucous membranes are moist. Posterior pharynx clear of any exudate or lesions.Normal dentition.  Neck: normal, supple, no masses, no thyromegaly Respiratory: Diminished breathing sound bilaterally, diffused wheezing, scattered crackles, increasing respiratory effort. No accessory muscle use.  Cardiovascular: Regular rate and rhythm, no murmurs / rubs / gallops. No extremity edema. 2+ pedal pulses. No carotid bruits.  Abdomen: no tenderness, no masses palpated. No hepatosplenomegaly. Bowel sounds positive.  Musculoskeletal: no clubbing / cyanosis. No joint deformity upper and lower extremities. Good ROM, no contractures. Normal muscle tone.  Skin: no rashes, lesions, ulcers. No induration Neurologic: CN 2-12 grossly intact. Sensation intact, DTR normal. Strength 5/5 in all 4.  Psychiatric: Normal judgment and insight. Alert and oriented x 3. Normal mood.     Labs on Admission: I have personally reviewed following labs and imaging studies  CBC: Recent Labs  Lab 07/20/23 0921  WBC 7.0  HGB 14.1  HCT 42.5  MCV 91.4  PLT 152   Basic Metabolic Panel: Recent Labs  Lab 07/20/23 0921  NA 132*  K 3.7  CL 98   CO2 24  GLUCOSE 142*  BUN 13  CREATININE 0.89  CALCIUM 8.9   GFR: Estimated Creatinine Clearance: 68.8 mL/min (by C-G formula based on SCr of 0.89 mg/dL). Liver Function Tests: No results for input(s): "AST", "ALT", "ALKPHOS", "BILITOT", "PROT", "ALBUMIN" in the last 168 hours. No results for input(s): "LIPASE", "AMYLASE" in the last 168 hours. No results for input(s): "AMMONIA" in the last 168 hours. Coagulation Profile: No results for input(s): "INR", "PROTIME" in the last 168 hours. Cardiac Enzymes: No results for input(s): "CKTOTAL", "CKMB", "CKMBINDEX", "TROPONINI" in the last 168 hours. BNP (last 3 results) No results for input(s): "PROBNP" in the last 8760 hours. HbA1C: No results for input(s): "HGBA1C" in the last 72 hours. CBG: No results  for input(s): "GLUCAP" in the last 168 hours. Lipid Profile: No results for input(s): "CHOL", "HDL", "LDLCALC", "TRIG", "CHOLHDL", "LDLDIRECT" in the last 72 hours. Thyroid Function Tests: No results for input(s): "TSH", "T4TOTAL", "FREET4", "T3FREE", "THYROIDAB" in the last 72 hours. Anemia Panel: No results for input(s): "VITAMINB12", "FOLATE", "FERRITIN", "TIBC", "IRON", "RETICCTPCT" in the last 72 hours. Urine analysis:    Component Value Date/Time   COLORURINE YELLOW (A) 12/07/2016 0905   APPEARANCEUR HAZY (A) 12/07/2016 0905   LABSPEC 1.014 12/07/2016 0905   PHURINE 5.0 12/07/2016 0905   GLUCOSEU NEGATIVE 12/07/2016 0905   HGBUR SMALL (A) 12/07/2016 0905   BILIRUBINUR neg 05/25/2017 1202   KETONESUR NEGATIVE 12/07/2016 0905   PROTEINUR neg 05/25/2017 1202   PROTEINUR NEGATIVE 12/07/2016 0905   NITRITE neg 05/25/2017 1202   NITRITE NEGATIVE 12/07/2016 0905   LEUKOCYTESUR Trace (A) 05/25/2017 1202    Radiological Exams on Admission: DG Chest 2 View Result Date: 07/20/2023 CLINICAL DATA:  Shortness of breath, cough. EXAM: CHEST - 2 VIEW COMPARISON:  July 06, 2023 FINDINGS: The heart size and mediastinal contours are  within normal limits. Both lungs are clear. The visualized skeletal structures are unremarkable. IMPRESSION: No active cardiopulmonary disease. Electronically Signed   By: Lupita Raider M.D.   On: 07/20/2023 09:49    EKG: Independently reviewed.  Sinus tach, no acute ST changes.  Assessment/Plan Principal Problem:   Fever Active Problems:   Moderate persistent asthma without complication   Influenza A with pneumonia  (please populate well all problems here in Problem List. (For example, if patient is on BP meds at home and you resume or decide to hold them, it is a problem that needs to be her. Same for CAD, COPD, HLD and so on)  Acute hypoxic respiratory failure Influenza A pneumonia Acute asthma exacerbation -Initially started patient on Tamiflu -Continue p.o. prednisone x 5 days -Continue ICS and LABA -DuoNebs and as needed albuterol -Incentive spirometry, peak flow  Sepsis, without acute endorgan damage -Sepsis evidenced by his elevated tachycardia, fever, new onset of hypoxia, source of infection is influenza A pneumonia -Given there is a diagnosis of influenza A pneumonia, will not pursue blood culture.  This was discussed with ED physician. -Management as above -Euvolemic, no indication for IV fluid  HTN, uncontrolled -Resume home BP meds amlodipine    DVT prophylaxis: SCD Code Status: Full code Family Communication: None at bedside Disposition Plan: Expect less than 2 midnight hospital stay Consults called: None Admission status: Telemetry observation   Emeline General MD [facility] Pager [phone]  07/20/2023, 10:41 AM

## 2023-07-20 NOTE — ED Triage Notes (Signed)
Patient to ED via [facility] for SOB- states her asthma has been bothering her. Took inhaler at home with no relief. Also c/o headache. Denies fever.

## 2023-07-20 NOTE — Plan of Care (Signed)
  Problem: Education: Goal: Knowledge of General Education information will improve Description: Including pain rating scale, medication(s)/side effects and non-pharmacologic comfort measures Outcome: Progressing   Problem: Coping: Goal: Level of anxiety will decrease Outcome: Progressing   Problem: Elimination: Goal: Will not experience complications related to bowel motility Outcome: Progressing   

## 2023-07-20 NOTE — ED Notes (Signed)
Pt noted to 87% on RA- placed on 2L [place] in triage.

## 2023-07-21 DIAGNOSIS — R509 Fever, unspecified: Secondary | ICD-10-CM | POA: Diagnosis not present

## 2023-07-21 DIAGNOSIS — Z91014 Allergy to mammalian meats: Secondary | ICD-10-CM | POA: Diagnosis not present

## 2023-07-21 DIAGNOSIS — Z9109 Other allergy status, other than to drugs and biological substances: Secondary | ICD-10-CM | POA: Diagnosis not present

## 2023-07-21 DIAGNOSIS — J45901 Unspecified asthma with (acute) exacerbation: Secondary | ICD-10-CM | POA: Diagnosis present

## 2023-07-21 DIAGNOSIS — R5081 Fever presenting with conditions classified elsewhere: Secondary | ICD-10-CM | POA: Diagnosis not present

## 2023-07-21 DIAGNOSIS — R0602 Shortness of breath: Secondary | ICD-10-CM | POA: Diagnosis not present

## 2023-07-21 DIAGNOSIS — A4189 Other specified sepsis: Secondary | ICD-10-CM | POA: Diagnosis not present

## 2023-07-21 DIAGNOSIS — Z8249 Family history of ischemic heart disease and other diseases of the circulatory system: Secondary | ICD-10-CM | POA: Diagnosis not present

## 2023-07-21 DIAGNOSIS — Z79899 Other long term (current) drug therapy: Secondary | ICD-10-CM | POA: Diagnosis not present

## 2023-07-21 DIAGNOSIS — J440 Chronic obstructive pulmonary disease with acute lower respiratory infection: Secondary | ICD-10-CM | POA: Diagnosis not present

## 2023-07-21 DIAGNOSIS — J9601 Acute respiratory failure with hypoxia: Secondary | ICD-10-CM | POA: Diagnosis not present

## 2023-07-21 DIAGNOSIS — Z91018 Allergy to other foods: Secondary | ICD-10-CM | POA: Diagnosis not present

## 2023-07-21 DIAGNOSIS — J101 Influenza due to other identified influenza virus with other respiratory manifestations: Secondary | ICD-10-CM | POA: Diagnosis not present

## 2023-07-21 DIAGNOSIS — Z91011 Allergy to milk products: Secondary | ICD-10-CM | POA: Diagnosis not present

## 2023-07-21 DIAGNOSIS — Z7951 Long term (current) use of inhaled steroids: Secondary | ICD-10-CM | POA: Diagnosis not present

## 2023-07-21 DIAGNOSIS — J1001 Influenza due to other identified influenza virus with the same other identified influenza virus pneumonia: Secondary | ICD-10-CM | POA: Diagnosis not present

## 2023-07-21 DIAGNOSIS — J4541 Moderate persistent asthma with (acute) exacerbation: Secondary | ICD-10-CM | POA: Diagnosis not present

## 2023-07-21 DIAGNOSIS — Z91013 Allergy to seafood: Secondary | ICD-10-CM | POA: Diagnosis not present

## 2023-07-21 DIAGNOSIS — I10 Essential (primary) hypertension: Secondary | ICD-10-CM | POA: Diagnosis not present

## 2023-07-21 DIAGNOSIS — Z1152 Encounter for screening for COVID-19: Secondary | ICD-10-CM | POA: Diagnosis not present

## 2023-07-21 DIAGNOSIS — Z833 Family history of diabetes mellitus: Secondary | ICD-10-CM | POA: Diagnosis not present

## 2023-07-21 DIAGNOSIS — J441 Chronic obstructive pulmonary disease with (acute) exacerbation: Secondary | ICD-10-CM | POA: Diagnosis not present

## 2023-07-21 DIAGNOSIS — R059 Cough, unspecified: Secondary | ICD-10-CM | POA: Diagnosis not present

## 2023-07-21 DIAGNOSIS — Z823 Family history of stroke: Secondary | ICD-10-CM | POA: Diagnosis not present

## 2023-07-21 MED ORDER — HYDROCORTISONE 1 % EX CREA
TOPICAL_CREAM | Freq: Three times a day (TID) | CUTANEOUS | Status: DC
  Filled 2023-07-21: qty 28

## 2023-07-21 MED ORDER — IPRATROPIUM-ALBUTEROL 0.5-2.5 (3) MG/3ML IN SOLN
3.0000 mL | Freq: Three times a day (TID) | RESPIRATORY_TRACT | Status: DC
  Administered 2023-07-21 – 2023-07-22 (×5): 3 mL via RESPIRATORY_TRACT
  Filled 2023-07-21 (×4): qty 3

## 2023-07-21 MED ORDER — METHYLPREDNISOLONE SODIUM SUCC 40 MG IJ SOLR
40.0000 mg | Freq: Two times a day (BID) | INTRAMUSCULAR | Status: DC
  Administered 2023-07-21 (×2): 40 mg via INTRAVENOUS
  Filled 2023-07-21 (×2): qty 1

## 2023-07-21 NOTE — Progress Notes (Signed)
Pt requesting cream for eczema. Lolita Patella, MD notified. See new order for hydrocortisone cream.

## 2023-07-21 NOTE — Plan of Care (Signed)

## 2023-07-21 NOTE — TOC CM/SW Note (Signed)
Transition of Care [facility]) - Inpatient Brief Assessment   Patient Details  Name: Autumn Lewis MRN: [id number] Date of Birth: Dec 27, 1975  Transition of Care [facility]) CM/SW Contact:    Chapman Fitch, RN Phone Number: 07/21/2023, 9:53 AM   Clinical Narrative:   Transition of Care [facility]) Screening Note   Patient Details  Name: Autumn Lewis Date of Birth: 08-21-1975   Transition of Care [facility]) CM/SW Contact:    Chapman Fitch, RN Phone Number: 07/21/2023, 9:53 AM    Transition of Care Department [facility]) has reviewed patient and no TOC needs have been identified at this time.If new patient transition needs arise, please place a TOC consult.  No PCP on file. List of local PCP added to AVS   Transition of Care Asessment: Insurance and Status: Insurance coverage has been reviewed Patient has primary care physician: No     Prior/Current Home Services: No current home services Social Drivers of Health Review: SDOH reviewed no interventions necessary Readmission risk has been reviewed: Yes Transition of care needs: no transition of care needs at this time

## 2023-07-21 NOTE — Plan of Care (Signed)

## 2023-07-21 NOTE — Progress Notes (Signed)
PROGRESS NOTE    Autumn Lewis  ZOX:[id number] DOB: 09/17/1975 DOA: 07/20/2023 PCP: Pcp, No    Brief Narrative:  48 y.o. female with medical history significant of moderate asthma, HTN, presented with worsening of cough wheezing shortness of breath and new onset of fever.   Symptoms started 2 days ago with URI-like symptoms with nasal congestion and runny nose, soon she started develop dry cough, wheezing and increasing exertional dyspnea.  She been using her albuterol around-the-clock with some help.  This morning she started to develop fever 101 at home and decided to come to the hospital.  She has had no influenza vaccination this year.   Assessment & Plan:   Principal Problem:   Fever Active Problems:   Moderate persistent asthma without complication   Influenza A with pneumonia   Chronic asthma   Asthma exacerbation   Acute hypoxic respiratory failure Influenza A infection Acute asthma exacerbation Tamiflu and steroids on admission Plan: Continue Tamiflu IV Solu-Medrol Multimodal bronchodilator regimen Incentive spirometry Oxygen if necessary  Sepsis, without acute endorgan damage -Sepsis evidenced by his elevated tachycardia, fever, new onset of hypoxia, source of infection is influenza A  -Given there is a diagnosis of influenza A pneumonia, will not pursue blood culture.   Plan: -Management as above -Euvolemic, no indication for IV fluid   HTN, uncontrolled -Resume home BP meds amlodipine      DVT prophylaxis: SCDs Code Status: Full Family Communication: None today Disposition Plan: Status is: Inpatient Remains inpatient appropriate because: Influenza A infection and concomitant asthma exacerbation   Level of care: Med-Surg  Consultants:  None  Procedures:  None   Antimicrobials: Tamiflu   Subjective: Seen and examined.  Resting in bed.  Reports improvement in shortness of breath since admission.  Negative baseline.  Objective: Vitals:    07/20/23 2027 07/20/23 2100 07/21/23 0348 07/21/23 0831  BP:   (!) 145/96 (!) 142/97  Pulse:   73 86  Resp:   16 19  Temp:  98.5 F (36.9 C) 98.7 F (37.1 C) 98.9 F (37.2 C)  TempSrc:  Oral Oral Oral  SpO2: 93%  99% 94%  Weight:      Height:        Intake/Output Summary (Last 24 hours) at 07/21/2023 1250 Last data filed at 07/21/2023 0900 Gross per 24 hour  Intake 240 ml  Output --  Net 240 ml   Filed Weights   07/20/23 0914  Weight: 68 kg    Examination:  General exam: Appears calm and comfortable  Respiratory system: Diminished breath sounds bilaterally.  Mild end expiratory wheeze.  Normal work of breathing.  Room air Cardiovascular system: S1-S2, RRR, no murmurs, no pedal edema Gastrointestinal system: Soft, NT/ND, normal bowel sounds Central nervous system: Alert and oriented. No focal neurological deficits. Extremities: Symmetric 5 x 5 power. Skin: No rashes, lesions or ulcers Psychiatry: Judgement and insight appear normal. Mood & affect appropriate.     Data Reviewed: I have personally reviewed following labs and imaging studies  CBC: Recent Labs  Lab 07/20/23 0921  WBC 7.0  HGB 14.1  HCT 42.5  MCV 91.4  PLT 152   Basic Metabolic Panel: Recent Labs  Lab 07/20/23 0921  NA 132*  K 3.7  CL 98  CO2 24  GLUCOSE 142*  BUN 13  CREATININE 0.89  CALCIUM 8.9   GFR: Estimated Creatinine Clearance: 68.8 mL/min (by C-G formula based on SCr of 0.89 mg/dL). Liver Function Tests: No results for input(s): "AST", "  ALT", "ALKPHOS", "BILITOT", "PROT", "ALBUMIN" in the last 168 hours. No results for input(s): "LIPASE", "AMYLASE" in the last 168 hours. No results for input(s): "AMMONIA" in the last 168 hours. Coagulation Profile: No results for input(s): "INR", "PROTIME" in the last 168 hours. Cardiac Enzymes: No results for input(s): "CKTOTAL", "CKMB", "CKMBINDEX", "TROPONINI" in the last 168 hours. BNP (last 3 results) No results for input(s): "PROBNP" in  the last 8760 hours. HbA1C: No results for input(s): "HGBA1C" in the last 72 hours. CBG: No results for input(s): "GLUCAP" in the last 168 hours. Lipid Profile: No results for input(s): "CHOL", "HDL", "LDLCALC", "TRIG", "CHOLHDL", "LDLDIRECT" in the last 72 hours. Thyroid Function Tests: No results for input(s): "TSH", "T4TOTAL", "FREET4", "T3FREE", "THYROIDAB" in the last 72 hours. Anemia Panel: No results for input(s): "VITAMINB12", "FOLATE", "FERRITIN", "TIBC", "IRON", "RETICCTPCT" in the last 72 hours. Sepsis Labs: No results for input(s): "PROCALCITON", "LATICACIDVEN" in the last 168 hours.  Recent Results (from the past 240 hours)  Resp panel by RT-PCR (RSV, Flu A&B, Covid) Anterior Nasal Swab     Status: Abnormal   Collection Time: 07/20/23  9:21 AM   Specimen: Anterior Nasal Swab  Result Value Ref Range Status   SARS Coronavirus 2 by RT PCR NEGATIVE NEGATIVE Final    Comment: (NOTE) SARS-CoV-2 target nucleic acids are NOT DETECTED.  The SARS-CoV-2 RNA is generally detectable in upper respiratory specimens during the acute phase of infection. The lowest concentration of SARS-CoV-2 viral copies this assay can detect is 138 copies/mL. A negative result does not preclude SARS-Cov-2 infection and should not be used as the sole basis for treatment or other patient management decisions. A negative result may occur with  improper specimen collection/handling, submission of specimen other than nasopharyngeal swab, presence of viral mutation(s) within the areas targeted by this assay, and inadequate number of viral copies(<138 copies/mL). A negative result must be combined with clinical observations, patient history, and epidemiological information. The expected result is Negative.  Fact Sheet for Patients:  BloggerCourse.com  Fact Sheet for Healthcare Providers:  SeriousBroker.it  This test is no t yet approved or cleared by  the [place] FDA and  has been authorized for detection and/or diagnosis of SARS-CoV-2 by FDA under an Emergency Use Authorization (EUA). This EUA will remain  in effect (meaning this test can be used) for the duration of the COVID-19 declaration under Section 564(b)(1) of the Act, 21 U.S.C.section 360bbb-3(b)(1), unless the authorization is terminated  or revoked sooner.       Influenza A by PCR POSITIVE (A) NEGATIVE Final   Influenza B by PCR NEGATIVE NEGATIVE Final    Comment: (NOTE) The Xpert Xpress SARS-CoV-2/FLU/RSV plus assay is intended as an aid in the diagnosis of influenza from Nasopharyngeal swab specimens and should not be used as a sole basis for treatment. Nasal washings and aspirates are unacceptable for Xpert Xpress SARS-CoV-2/FLU/RSV testing.  Fact Sheet for Patients: BloggerCourse.com  Fact Sheet for Healthcare Providers: SeriousBroker.it  This test is not yet approved or cleared by the [place] FDA and has been authorized for detection and/or diagnosis of SARS-CoV-2 by FDA under an Emergency Use Authorization (EUA). This EUA will remain in effect (meaning this test can be used) for the duration of the COVID-19 declaration under Section 564(b)(1) of the Act, 21 U.S.C. section 360bbb-3(b)(1), unless the authorization is terminated or revoked.     Resp Syncytial Virus by PCR NEGATIVE NEGATIVE Final    Comment: (NOTE) Fact Sheet for Patients: BloggerCourse.com  Fact Sheet for Healthcare Providers: SeriousBroker.it  This test is not yet approved or cleared by the [place] FDA and has been authorized for detection and/or diagnosis of SARS-CoV-2 by FDA under an Emergency Use Authorization (EUA). This EUA will remain in effect (meaning this test can be used) for the duration of the COVID-19 declaration under Section 564(b)(1) of the Act, 21  U.S.C. section 360bbb-3(b)(1), unless the authorization is terminated or revoked.  Performed at [facility], [address], [place], [place] [address]          Radiology Studies: DG Chest 2 View Result Date: 07/20/2023 CLINICAL DATA:  Shortness of breath, cough. EXAM: CHEST - 2 VIEW COMPARISON:  July 06, 2023 FINDINGS: The heart size and mediastinal contours are within normal limits. Both lungs are clear. The visualized skeletal structures are unremarkable. IMPRESSION: No active cardiopulmonary disease. Electronically Signed   By: Lupita Raider M.D.   On: 07/20/2023 09:49        Scheduled Meds:  amLODipine  5 mg Oral Daily   fluticasone  1 spray Each Nare Daily   fluticasone furoate-vilanterol  1 puff Inhalation Daily   guaiFENesin  600 mg Oral BID   ipratropium-albuterol  3 mL Nebulization TID   loratadine  10 mg Oral Daily   methylPREDNISolone (SOLU-MEDROL) injection  40 mg Intravenous Q12H   montelukast  10 mg Oral QHS   oseltamivir  75 mg Oral BID   Continuous Infusions:   LOS: 0 days    Tresa Moore, MD [facility]   If 7PM-7AM, please contact night-coverage  07/21/2023, 12:50 PM

## 2023-07-22 DIAGNOSIS — R5081 Fever presenting with conditions classified elsewhere: Secondary | ICD-10-CM | POA: Diagnosis not present

## 2023-07-22 MED ORDER — PREDNISONE 20 MG PO TABS: 40.0000 mg | ORAL_TABLET | Freq: Every day | ORAL | 0 refills | Status: AC

## 2023-07-22 MED ORDER — METHYLPREDNISOLONE SODIUM SUCC 40 MG IJ SOLR: 40.0000 mg | INTRAMUSCULAR | Status: DC

## 2023-07-22 NOTE — Discharge Summary (Signed)
Physician Discharge Summary  Autumn Lewis XBJ:[id number] DOB: 08-Jul-1975 DOA: 07/20/2023  PCP: Pcp, No  Admit date: 07/20/2023 Discharge date: 07/22/2023  Admitted From: Home Disposition:  Home  Recommendations for Outpatient Follow-up:  Follow up with PCP in 1-2 weeks Ambulatory referral to pulmonology  Home Health: No Equipment/Devices: None  Discharge Condition: Stable CODE STATUS: Full Diet recommendation: Regular  Brief/Interim Summary:  48 y.o. female with medical history significant of moderate asthma, HTN, presented with worsening of cough wheezing shortness of breath and new onset of fever.   Symptoms started 2 days ago with URI-like symptoms with nasal congestion and runny nose, soon she started develop dry cough, wheezing and increasing exertional dyspnea.  She been using her albuterol around-the-clock with some help.  This morning she started to develop fever 101 at home and decided to come to the hospital.  She has had no influenza vaccination this year.    Discharge Diagnoses:  Principal Problem:   Fever Active Problems:   Moderate persistent asthma without complication   Influenza A with pneumonia   Chronic asthma   Asthma exacerbation Acute hypoxic respiratory failure Influenza A infection Acute asthma exacerbation Tamiflu and steroids on admission Plan: Stable for discharge home.  Continue p.o. prednisone 40 mg daily x 5 additional days.  Resume home bronchodilator regimen.  Ambulatory referral to pulmonology placed at time of discharge.  Patient ambulated around nursing unit without desaturation.  Maintained mid 90s on room air.  Sepsis, without acute endorgan damage -Sepsis evidenced by his elevated tachycardia, fever, new onset of hypoxia, source of infection is influenza A  -Given there is a diagnosis of influenza A pneumonia, will not pursue blood culture.      Discharge Instructions  Discharge Instructions     Ambulatory referral to Pulmonology    Complete by: As directed    Reason for referral: Asthma/COPD   Diet - low sodium heart healthy   Complete by: As directed    Increase activity slowly   Complete by: As directed       Allergies as of 07/22/2023       Reactions   Strawberry Extract Anaphylaxis   Milk-related Compounds Diarrhea   Pork-derived Products Hives   Shellfish Allergy Other (See Comments)   Was told not to eat after allergy tests   Tomato    Tree Extract Other (See Comments)   Pt states unknown reation        Medication List     STOP taking these medications    predniSONE 10 MG (21) Tbpk tablet Commonly known as: STERAPRED UNI-PAK 21 TAB Replaced by: predniSONE 20 MG tablet       TAKE these medications    AeroChamber MV inhaler Use as instructed   albuterol 108 (90 Base) MCG/ACT inhaler Commonly known as: VENTOLIN HFA Inhale 2 puffs into the lungs every 6 (six) hours as needed for wheezing or shortness of breath.   amLODipine 5 MG tablet Commonly known as: NORVASC Take 1 tablet (5 mg total) by mouth daily.   benzonatate 100 MG capsule Commonly known as: Tessalon Perles Take 1-2 tabs TID prn cough   cetirizine 10 MG tablet Commonly known as: ZYRTEC 1 tab by mouth daily as needed   fluticasone furoate-vilanterol 100-25 MCG/ACT Aepb Commonly known as: Breo Ellipta Inhale 1 puff into the lungs daily.   guaiFENesin 600 MG 12 hr tablet Commonly known as: Mucinex Take 1 tablet (600 mg total) by mouth 2 (two) times daily.   ipratropium-albuterol 0.5-2.5 (  3) MG/3ML Soln Commonly known as: DUONEB Take 3 mLs by nebulization every 4 (four) hours as needed.   loratadine 10 MG tablet Commonly known as: CLARITIN Take 10 mg by mouth daily.   mometasone 50 MCG/ACT nasal spray Commonly known as: Nasonex 2 sprays each nostril daily.   montelukast 10 MG tablet Commonly known as: SINGULAIR Take 1 tablet (10 mg total) by mouth at bedtime.   predniSONE 20 MG tablet Commonly known as:  DELTASONE Take 2 tablets (40 mg total) by mouth daily for 5 days. First dose 1/26 Replaces: predniSONE 10 MG (21) Tbpk tablet   triamcinolone ointment 0.1 % Commonly known as: KENALOG Apply 1 Application topically 2 (two) times daily.        Allergies  Allergen Reactions   Strawberry Extract Anaphylaxis   Milk-Related Compounds Diarrhea   Pork-Derived Products Hives   Shellfish Allergy Other (See Comments)    Was told not to eat after allergy tests   Tomato    Tree Extract Other (See Comments)    Pt states unknown reation    Consultations: None   Procedures/Studies: DG Chest 2 View Result Date: 07/20/2023 CLINICAL DATA:  Shortness of breath, cough. EXAM: CHEST - 2 VIEW COMPARISON:  July 06, 2023 FINDINGS: The heart size and mediastinal contours are within normal limits. Both lungs are clear. The visualized skeletal structures are unremarkable. IMPRESSION: No active cardiopulmonary disease. Electronically Signed   By: Lupita Raider M.D.   On: 07/20/2023 09:49   DG Chest 2 View Result Date: 07/06/2023 CLINICAL DATA:  Shortness of breath. EXAM: CHEST - 2 VIEW COMPARISON:  06/05/2023. FINDINGS: Bilateral lung fields are clear. Bilateral costophrenic angles are clear. Normal cardio-mediastinal silhouette. No acute osseous abnormalities. The soft tissues are within normal limits. IMPRESSION: No active cardiopulmonary disease. Electronically Signed   By: Jules Schick M.D.   On: 07/06/2023 15:04      Subjective: Seen and examined on the day of discharge.  Stable no distress.  Appropriate discharge home.  Discharge Exam: Vitals:   07/22/23 1042 07/22/23 1408  BP: (!) 137/98   Pulse:  82  Resp:  16  Temp:    SpO2:  97%   Vitals:   07/22/23 0328 07/22/23 0758 07/22/23 1042 07/22/23 1408  BP: (!) 136/95  (!) 137/98   Pulse: 74 72  82  Resp: 18 18  16   Temp: 98.1 F (36.7 C)     TempSrc: Oral     SpO2: 98% 97%  97%  Weight:      Height:        General: Pt is  alert, awake, not in acute distress Cardiovascular: RRR, S1/S2 +, no rubs, no gallops Respiratory: CTA bilaterally, no wheezing, no rhonchi Abdominal: Soft, NT, ND, bowel sounds + Extremities: no edema, no cyanosis    The results of significant diagnostics from this hospitalization (including imaging, microbiology, ancillary and laboratory) are listed below for reference.     Microbiology: Recent Results (from the past 240 hours)  Resp panel by RT-PCR (RSV, Flu A&B, Covid) Anterior Nasal Swab     Status: Abnormal   Collection Time: 07/20/23  9:21 AM   Specimen: Anterior Nasal Swab  Result Value Ref Range Status   SARS Coronavirus 2 by RT PCR NEGATIVE NEGATIVE Final    Comment: (NOTE) SARS-CoV-2 target nucleic acids are NOT DETECTED.  The SARS-CoV-2 RNA is generally detectable in upper respiratory specimens during the acute phase of infection. The lowest concentration of SARS-CoV-2 viral  copies this assay can detect is 138 copies/mL. A negative result does not preclude SARS-Cov-2 infection and should not be used as the sole basis for treatment or other patient management decisions. A negative result may occur with  improper specimen collection/handling, submission of specimen other than nasopharyngeal swab, presence of viral mutation(s) within the areas targeted by this assay, and inadequate number of viral copies(<138 copies/mL). A negative result must be combined with clinical observations, patient history, and epidemiological information. The expected result is Negative.  Fact Sheet for Patients:  BloggerCourse.com  Fact Sheet for Healthcare Providers:  SeriousBroker.it  This test is no t yet approved or cleared by the [place] FDA and  has been authorized for detection and/or diagnosis of SARS-CoV-2 by FDA under an Emergency Use Authorization (EUA). This EUA will remain  in effect (meaning this test can be used) for  the duration of the COVID-19 declaration under Section 564(b)(1) of the Act, 21 U.S.C.section 360bbb-3(b)(1), unless the authorization is terminated  or revoked sooner.       Influenza A by PCR POSITIVE (A) NEGATIVE Final   Influenza B by PCR NEGATIVE NEGATIVE Final    Comment: (NOTE) The Xpert Xpress SARS-CoV-2/FLU/RSV plus assay is intended as an aid in the diagnosis of influenza from Nasopharyngeal swab specimens and should not be used as a sole basis for treatment. Nasal washings and aspirates are unacceptable for Xpert Xpress SARS-CoV-2/FLU/RSV testing.  Fact Sheet for Patients: BloggerCourse.com  Fact Sheet for Healthcare Providers: SeriousBroker.it  This test is not yet approved or cleared by the [place] FDA and has been authorized for detection and/or diagnosis of SARS-CoV-2 by FDA under an Emergency Use Authorization (EUA). This EUA will remain in effect (meaning this test can be used) for the duration of the COVID-19 declaration under Section 564(b)(1) of the Act, 21 U.S.C. section 360bbb-3(b)(1), unless the authorization is terminated or revoked.     Resp Syncytial Virus by PCR NEGATIVE NEGATIVE Final    Comment: (NOTE) Fact Sheet for Patients: BloggerCourse.com  Fact Sheet for Healthcare Providers: SeriousBroker.it  This test is not yet approved or cleared by the [place] FDA and has been authorized for detection and/or diagnosis of SARS-CoV-2 by FDA under an Emergency Use Authorization (EUA). This EUA will remain in effect (meaning this test can be used) for the duration of the COVID-19 declaration under Section 564(b)(1) of the Act, 21 U.S.C. section 360bbb-3(b)(1), unless the authorization is terminated or revoked.  Performed at [facility], [address] [place], [place] [address]      Labs: BNP (last 3 results) No results for  input(s): "BNP" in the last 8760 hours. Basic Metabolic Panel: Recent Labs  Lab 07/20/23 0921  NA 132*  K 3.7  CL 98  CO2 24  GLUCOSE 142*  BUN 13  CREATININE 0.89  CALCIUM 8.9   Liver Function Tests: No results for input(s): "AST", "ALT", "ALKPHOS", "BILITOT", "PROT", "ALBUMIN" in the last 168 hours. No results for input(s): "LIPASE", "AMYLASE" in the last 168 hours. No results for input(s): "AMMONIA" in the last 168 hours. CBC: Recent Labs  Lab 07/20/23 0921  WBC 7.0  HGB 14.1  HCT 42.5  MCV 91.4  PLT 152   Cardiac Enzymes: No results for input(s): "CKTOTAL", "CKMB", "CKMBINDEX", "TROPONINI" in the last 168 hours. BNP: Invalid input(s): "POCBNP" CBG: No results for input(s): "GLUCAP" in the last 168 hours. D-Dimer No results for input(s): "DDIMER" in the last 72 hours. Hgb A1c No results for  input(s): "HGBA1C" in the last 72 hours. Lipid Profile No results for input(s): "CHOL", "HDL", "LDLCALC", "TRIG", "CHOLHDL", "LDLDIRECT" in the last 72 hours. Thyroid function studies No results for input(s): "TSH", "T4TOTAL", "T3FREE", "THYROIDAB" in the last 72 hours.  Invalid input(s): "FREET3" Anemia work up No results for input(s): "VITAMINB12", "FOLATE", "FERRITIN", "TIBC", "IRON", "RETICCTPCT" in the last 72 hours. Urinalysis    Component Value Date/Time   COLORURINE YELLOW (A) 12/07/2016 0905   APPEARANCEUR HAZY (A) 12/07/2016 0905   LABSPEC 1.014 12/07/2016 0905   PHURINE 5.0 12/07/2016 0905   GLUCOSEU NEGATIVE 12/07/2016 0905   HGBUR SMALL (A) 12/07/2016 0905   BILIRUBINUR neg 05/25/2017 1202   KETONESUR NEGATIVE 12/07/2016 0905   PROTEINUR neg 05/25/2017 1202   PROTEINUR NEGATIVE 12/07/2016 0905   NITRITE neg 05/25/2017 1202   NITRITE NEGATIVE 12/07/2016 0905   LEUKOCYTESUR Trace (A) 05/25/2017 1202   Sepsis Labs Recent Labs  Lab 07/20/23 0921  WBC 7.0   Microbiology Recent Results (from the past 240 hours)  Resp panel by RT-PCR (RSV, Flu A&B,  Covid) Anterior Nasal Swab     Status: Abnormal   Collection Time: 07/20/23  9:21 AM   Specimen: Anterior Nasal Swab  Result Value Ref Range Status   SARS Coronavirus 2 by RT PCR NEGATIVE NEGATIVE Final    Comment: (NOTE) SARS-CoV-2 target nucleic acids are NOT DETECTED.  The SARS-CoV-2 RNA is generally detectable in upper respiratory specimens during the acute phase of infection. The lowest concentration of SARS-CoV-2 viral copies this assay can detect is 138 copies/mL. A negative result does not preclude SARS-Cov-2 infection and should not be used as the sole basis for treatment or other patient management decisions. A negative result may occur with  improper specimen collection/handling, submission of specimen other than nasopharyngeal swab, presence of viral mutation(s) within the areas targeted by this assay, and inadequate number of viral copies(<138 copies/mL). A negative result must be combined with clinical observations, patient history, and epidemiological information. The expected result is Negative.  Fact Sheet for Patients:  BloggerCourse.com  Fact Sheet for Healthcare Providers:  SeriousBroker.it  This test is no t yet approved or cleared by the [place] FDA and  has been authorized for detection and/or diagnosis of SARS-CoV-2 by FDA under an Emergency Use Authorization (EUA). This EUA will remain  in effect (meaning this test can be used) for the duration of the COVID-19 declaration under Section 564(b)(1) of the Act, 21 U.S.C.section 360bbb-3(b)(1), unless the authorization is terminated  or revoked sooner.       Influenza A by PCR POSITIVE (A) NEGATIVE Final   Influenza B by PCR NEGATIVE NEGATIVE Final    Comment: (NOTE) The Xpert Xpress SARS-CoV-2/FLU/RSV plus assay is intended as an aid in the diagnosis of influenza from Nasopharyngeal swab specimens and should not be used as a sole basis for treatment.  Nasal washings and aspirates are unacceptable for Xpert Xpress SARS-CoV-2/FLU/RSV testing.  Fact Sheet for Patients: BloggerCourse.com  Fact Sheet for Healthcare Providers: SeriousBroker.it  This test is not yet approved or cleared by the [place] FDA and has been authorized for detection and/or diagnosis of SARS-CoV-2 by FDA under an Emergency Use Authorization (EUA). This EUA will remain in effect (meaning this test can be used) for the duration of the COVID-19 declaration under Section 564(b)(1) of the Act, 21 U.S.C. section 360bbb-3(b)(1), unless the authorization is terminated or revoked.     Resp Syncytial Virus by PCR NEGATIVE NEGATIVE Final    Comment: (  NOTE) Fact Sheet for Patients: BloggerCourse.com  Fact Sheet for Healthcare Providers: SeriousBroker.it  This test is not yet approved or cleared by the [place] FDA and has been authorized for detection and/or diagnosis of SARS-CoV-2 by FDA under an Emergency Use Authorization (EUA). This EUA will remain in effect (meaning this test can be used) for the duration of the COVID-19 declaration under Section 564(b)(1) of the Act, 21 U.S.C. section 360bbb-3(b)(1), unless the authorization is terminated or revoked.  Performed at [facility], [address], [place], [place] [address]      Time coordinating discharge: Over 30 minutes  SIGNED:   Tresa Moore, MD  [facility] 07/22/2023, 2:26 PM Pager   If 7PM-7AM, please contact night-coverage

## 2023-07-22 NOTE — Plan of Care (Signed)

## 2023-07-22 NOTE — TOC Progression Note (Signed)
Transition of Care [facility]) - Progression Note    Patient Details  Name: Autumn Lewis MRN: [id number] Date of Birth: 02/03/1976  Transition [facility] CM/SW Contact  Rodney Langton, RN Phone Number: 07/22/2023, 10:24 AM  Clinical Narrative:    Spoke with patient as she inquired about process for disability application. Patient report she has already applied and wanted TOC team to speed up the process.  Advised this is not done in the hospital and she will have to continue to follow up once she is discharged. She verbalized understanding.    Noted that patient does not have PCP, confirmed PCP resources were added to the AVS.         Expected Discharge Plan and Services                                               Social Determinants of Health (SDOH) Interventions SDOH Screenings   Food Insecurity: No Food Insecurity (07/20/2023)  Housing: Low Risk  (07/20/2023)  Transportation Needs: No Transportation Needs (07/20/2023)  Utilities: Not At Risk (07/20/2023)  Financial Resource Strain: High Risk (11/15/2018)  Physical Activity: Sufficiently Active (11/15/2018)  Stress: Stress Concern Present (11/15/2018)  Tobacco Use: Low Risk  (07/20/2023)    Readmission Risk Interventions     No data to display

## 2023-08-01 ENCOUNTER — Other Ambulatory Visit: Payer: Self-pay | Admitting: Pulmonary Disease

## 2023-08-01 ENCOUNTER — Encounter: Payer: Self-pay | Admitting: Pulmonary Disease

## 2023-08-01 ENCOUNTER — Other Ambulatory Visit
Admission: RE | Admit: 2023-08-01 | Discharge: 2023-08-01 | Disposition: A | Discharge status: Home / Self Care | Payer: Medicaid Other | Source: Ambulatory Visit | Attending: Pulmonary Disease | Admitting: Pulmonary Disease

## 2023-08-01 ENCOUNTER — Ambulatory Visit: Payer: Medicaid Other | Admitting: Pulmonary Disease

## 2023-08-01 VITALS — BP 140/82 | HR 84 | Ht 65.0 in | Wt 159.4 lb

## 2023-08-01 DIAGNOSIS — J4550 Severe persistent asthma, uncomplicated: Secondary | ICD-10-CM

## 2023-08-01 MED ORDER — FLUTICASONE FUROATE-VILANTEROL 200-25 MCG/ACT IN AEPB: 1.0000 | INHALATION_SPRAY | Freq: Every day | RESPIRATORY_TRACT | 3 refills | Status: DC

## 2023-08-01 MED ORDER — FLUTICASONE-SALMETEROL 500-50 MCG/ACT IN AEPB: 1.0000 | INHALATION_SPRAY | Freq: Two times a day (BID) | RESPIRATORY_TRACT | 3 refills | Status: DC

## 2023-08-01 MED ORDER — TRELEGY ELLIPTA 200-62.5-25 MCG/ACT IN AEPB: 1.0000 | INHALATION_SPRAY | Freq: Every day | RESPIRATORY_TRACT | 0 refills | Status: DC

## 2023-08-01 NOTE — Addendum Note (Signed)
Addended by: Lonell Face C on: 08/01/2023 01:52 PM   Modules accepted: Orders

## 2023-08-01 NOTE — Progress Notes (Signed)
 Synopsis: Referred in by Autumn Calvin NOVAK, MD   Subjective:   PATIENT ID: Autumn Lewis GENDER: female DOB: 02/05/1976, MRN: [id number]  Chief Complaint  Patient presents with   pulmonary consult    SOB with exertion, dry cough at times prod with clear sputum and wheezing.     HPI Ms. Autumn Lewis is a 48 year old female patient with a past medical history of intermittent asthma presenting today to the pulmonary clinic to establish care.  She was diagnosed with asthma 10 years ago and has been on albuterol  on an as-needed basis.  She was recently hospitalized from January 21 to January 25 with an asthma exacerbation and was discharged home on Breo.  She currently feels better however she still have difficulty breathing and using her rescue inhaler on a daily basis.  I think she understood that she needs to use Breo on an as-needed basis as well and has been using it that way.  She was never been hospitalized for an asthma exacerbation in the past.  She does report hypersensitivity to humidity cold air and strong scents.  Chest x-ray 07/20/2023 -no active cardiopulmonary disease. Family history -grandmother with asthma  Social history -never smoker-denies vaping-denies alcohol use-denies any illicit drug use.  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  She has dogs cats and birds at home.  She does not have any kids.  ROS All systems were reviewed and are negative except for the above.  Objective:   Vitals:   08/01/23 1312 08/01/23 1313  BP: (!) 140/82 (!) 140/82  Pulse: 84 84  SpO2: 100% 100%  Weight:  159 lb 6.4 oz (72.3 kg)  Height:  5' 5 (1.651 m)   100% on RA BMI Readings from Last 3 Encounters:  08/01/23 26.53 kg/m  07/20/23 24.96 kg/m  07/06/23 26.12 kg/m   Wt Readings from Last 3 Encounters:  08/01/23 159 lb 6.4 oz (72.3 kg)  07/20/23 150 lb (68 kg)  07/06/23 156 lb 15.5 oz (71.2 kg)    Physical Exam GEN: NAD, Healthy  Appearing HEENT: Supple Neck, Reactive Pupils, EOMI  CVS: Normal S1, Normal S2, RRR, No murmurs or ES appreciated  Lungs: Poor air movement.  But otherwise clear. Abdomen: Soft, non tender, non distended, + BS  Extremities: Warm and well perfused, No edema  Skin: No suspicious lesions appreciated  Psych: Normal Affect  Ancillary Information   CBC    Component Value Date/Time   WBC 7.0 07/20/2023 0921   RBC 4.65 07/20/2023 0921   HGB 14.1 07/20/2023 0921   HGB 13.5 11/15/2018 1209   HCT 42.5 07/20/2023 0921   HCT 39.3 11/15/2018 1209   PLT 152 07/20/2023 0921   PLT 202 11/15/2018 1209   MCV 91.4 07/20/2023 0921   MCV 92 11/15/2018 1209   MCH 30.3 07/20/2023 0921   MCHC 33.2 07/20/2023 0921   RDW 13.3 07/20/2023 0921   RDW 13.2 11/15/2018 1209   LYMPHSABS 1.3 07/06/2023 1316   LYMPHSABS 2.0 11/15/2018 1209   MONOABS 0.5 07/06/2023 1316   EOSABS 0.3 07/06/2023 1316   EOSABS 0.2 11/15/2018 1209   BASOSABS 0.0 07/06/2023 1316   BASOSABS 0.0 11/15/2018 1209    Labs and imaging were reviewed.     No data to display           Assessment & Plan:  Ms. Autumn Lewis is a 48 year old female patient with a past medical history of intermittent asthma presenting today  to the pulmonary clinic to establish care.  # Severe persistent asthma EOS 200 - unable to obtain [place]. - Chest x-ray normal  []  Obtain PFTs []  Start high-dose ICS/LABA (pending insurance authorization) []  Continue with albuterol  on an as-needed basis. []  Obtain an allergen panel with total IgE.  Return in about 4 weeks (around 08/29/2023).  I spent 60 minutes caring for this patient today, including preparing to see the patient, obtaining a medical history , reviewing a separately obtained history, performing a medically appropriate examination and/or evaluation, counseling and educating the patient/family/caregiver, ordering medications, tests, or procedures, documenting clinical information in the electronic health  record, and independently interpreting results (not separately reported/billed) and communicating results to the patient/family/caregiver  Darrin Barn, MD [facility] 08/01/2023 1:41 PM

## 2023-08-04 LAB — ALLERGEN PANEL (27) + IGE
Alternaria Alternata IgE: 0.8 kU/L — AB
Aspergillus Fumigatus IgE: <0.1 kU/L
Bahia Grass IgE: 0.15 kU/L — AB
Bermuda Grass IgE: <0.1 kU/L
Cat Dander IgE: <0.1 kU/L
Cedar, Mountain IgE: <0.1 kU/L
Cladosporium Herbarum IgE: <0.1 kU/L
Cocklebur IgE: 0.11 kU/L — AB
Cockroach, American IgE: 0.38 kU/L — AB
Common Silver Birch IgE: 0.45 kU/L — AB
D Farinae IgE: 1.24 kU/L — AB
D Pteronyssinus IgE: 0.92 kU/L — AB
Dog Dander IgE: <0.1 kU/L
Elm, American IgE: <0.1 kU/L
Hickory, White IgE: <0.1 kU/L
IgE (Immunoglobulin E), Serum: 85 [IU]/mL (ref 6–495)
Johnson Grass IgE: <0.1 kU/L
Kentucky Bluegrass IgE: 0.76 kU/L — AB
Maple/Box Elder IgE: <0.1 kU/L
Mucor Racemosus IgE: <0.1 kU/L
Oak, White IgE: 0.52 kU/L — AB
Penicillium Chrysogen IgE: <0.1 kU/L
Pigweed, Rough IgE: <0.1 kU/L
Plantain, English IgE: <0.1 kU/L
Ragweed, Short IgE: 0.32 kU/L — AB
Setomelanomma Rostrat: <0.1 kU/L
Timothy Grass IgE: 0.52 kU/L — AB
White Mulberry IgE: <0.1 kU/L

## 2023-08-23 NOTE — Telephone Encounter (Signed)
 Per pharmacy:  All Pharmacy Suggested Alternatives:  fluticasone-salmeterol (ADVAIR HFA) 230-21 MCG/ACT inhaler mometasone-formoterol (DULERA) 200-5 MCG/ACT AERO    Please advise, thank you!

## 2023-08-29 ENCOUNTER — Encounter: Payer: Self-pay | Admitting: Obstetrics & Gynecology

## 2023-08-29 ENCOUNTER — Encounter: Payer: Medicaid Other | Admitting: Obstetrics & Gynecology

## 2023-08-29 DIAGNOSIS — O10919 Unspecified pre-existing hypertension complicating pregnancy, unspecified trimester: Secondary | ICD-10-CM

## 2023-08-29 DIAGNOSIS — O09529 Supervision of elderly multigravida, unspecified trimester: Secondary | ICD-10-CM | POA: Insufficient documentation

## 2023-08-29 DIAGNOSIS — O0990 Supervision of high risk pregnancy, unspecified, unspecified trimester: Secondary | ICD-10-CM

## 2023-08-29 HISTORY — DX: Unspecified pre-existing hypertension complicating pregnancy, unspecified trimester: O10.919

## 2023-08-29 HISTORY — DX: Supervision of high risk pregnancy, unspecified, unspecified trimester: O09.90

## 2023-09-07 ENCOUNTER — Encounter: Admitting: Obstetrics & Gynecology

## 2023-09-07 ENCOUNTER — Encounter: Payer: Medicaid Other | Admitting: Obstetrics & Gynecology

## 2023-09-14 ENCOUNTER — Telehealth: Payer: Self-pay | Admitting: Pulmonary Disease

## 2023-09-14 MED ORDER — FLUTICASONE FUROATE-VILANTEROL 100-25 MCG/ACT IN AEPB
1.0000 | INHALATION_SPRAY | Freq: Every day | RESPIRATORY_TRACT | 2 refills | Status: DC
Start: 2023-09-14 — End: 2023-10-26

## 2023-09-14 NOTE — Telephone Encounter (Signed)
 PT is ret Nurse Dimas's call. Please try again.

## 2023-09-14 NOTE — Telephone Encounter (Signed)
 Lmtcb

## 2023-09-14 NOTE — Telephone Encounter (Signed)
 Patient is requesting PA be done for [facility].  She reports that medication was given to her at the emergency room in January.   She reports Trelegy was too strong for her. She reports it caused her to cough. Patient reports she never picked up Advair, that was prescribed in February.   She is currently using Albuterol and Breo for cough, shortness of breath and wheezing that started a few days ago.   [facility] reports that they have a prescription for Advair Diskus 500-50 mcg, no co-pay is required.  Pharmacist confirmed that [facility] needs PA.   Please advise

## 2023-09-14 NOTE — Telephone Encounter (Signed)
 Per Dr. Larinda Buttery send in prescription for [facility]. Will send to pharmacy team to do PA.

## 2023-09-14 NOTE — Telephone Encounter (Signed)
 Pt states she is out of the Trelegy samples. She prefers the [place] but that needs a PA. Is using just the rescue inhaler at this time and feels she is over using. Pt is asking for a RX to be sent to preferred pharmacy on [address] in [place]. Is also asking for call when rx sent in letting her know

## 2023-09-15 ENCOUNTER — Telehealth: Payer: Self-pay

## 2023-09-15 ENCOUNTER — Other Ambulatory Visit (HOSPITAL_COMMUNITY): Payer: Self-pay

## 2023-09-15 NOTE — Telephone Encounter (Signed)
 Alternatives covered for Breo are as follows:   Brand Advair Diskus- filled 09/14/2023 Dulera- $0.00 Brand Symbicort- $0.00 Brand Advair HFA- $0.00  Typically patient needs to try and fail 2 preferred alternatives for the non-preferred to be covered.   Advair Diskus shows as being filled yesterday so I'm not sure if the patient is using that or not.

## 2023-09-15 NOTE — Telephone Encounter (Signed)
*  sent to office in original request/   Alternatives covered for Breo are as follows:   Brand Advair Diskus- filled 09/14/2023 Dulera- $0.00 Brand Symbicort- $0.00 Brand Advair HFA- $0.00  Typically patient needs to try and fail 2 preferred alternatives for the non-preferred to be covered.   Advair Diskus shows as being filled yesterday so I'm not sure if the patient is using that or not.

## 2023-09-18 NOTE — Telephone Encounter (Signed)
 Patient advised as below. Patient reports she has tried and failed Advair Disk, Symbicort, Spiriva Combivent and Repsimat in the past. She reports inhalers did not help with symptoms. Patient reports inhalers were previously prescribed in [place]. Patient reports she will pick up and try Advair Diskus today.

## 2023-09-28 ENCOUNTER — Encounter

## 2023-09-28 ENCOUNTER — Ambulatory Visit: Payer: Medicaid Other | Admitting: Pulmonary Disease

## 2023-09-29 ENCOUNTER — Encounter

## 2023-10-02 ENCOUNTER — Ambulatory Visit (INDEPENDENT_AMBULATORY_CARE_PROVIDER_SITE_OTHER): Admitting: Pulmonary Disease

## 2023-10-02 ENCOUNTER — Encounter: Payer: Self-pay | Admitting: Pulmonary Disease

## 2023-10-02 VITALS — BP 120/82 | HR 73 | Temp 96.9°F | Ht 65.0 in | Wt 161.6 lb

## 2023-10-02 DIAGNOSIS — J4550 Severe persistent asthma, uncomplicated: Secondary | ICD-10-CM | POA: Diagnosis not present

## 2023-10-02 MED ORDER — TRELEGY ELLIPTA 200-62.5-25 MCG/ACT IN AEPB
1.0000 | INHALATION_SPRAY | Freq: Every day | RESPIRATORY_TRACT | Status: DC
Start: 2023-10-02 — End: 2023-10-26

## 2023-10-02 MED ORDER — TRELEGY ELLIPTA 200-62.5-25 MCG/ACT IN AEPB: 1.0000 | INHALATION_SPRAY | Freq: Every day | RESPIRATORY_TRACT | 6 refills | Status: DC

## 2023-10-02 NOTE — Progress Notes (Signed)
 Synopsis: Referred in by No ref. provider found   Subjective:   PATIENT ID: Autumn Lewis GENDER: female DOB: 1975/12/12, MRN: [id number]  Chief Complaint  Patient presents with   Follow-up    SOB. Wheezing. Dry cough.    HPI Ms. Fake is a 48 year old female patient with a past medical history of intermittent asthma presenting today to the pulmonary clinic to establish care.  She was diagnosed with asthma 10 years ago and has been on albuterol on an as-needed basis.  She was recently hospitalized from January 21 to January 25 with an asthma exacerbation and was discharged home on Breo.  She currently feels better however she still have difficulty breathing and using her rescue inhaler on a daily basis.  I think she understood that she needs to use Breo on an as-needed basis as well and has been using it that way.  She was never been hospitalized for an asthma exacerbation in the past.  She does report hypersensitivity to humidity cold air and strong scents.  She is not happy with advair and still having wheezing and chest tightness.  Allergen panel was mildly positive for mostly dustmites and grass.   Chest x-ray 07/20/2023 -no active cardiopulmonary disease. Family history -grandmother with asthma  Social history -never smoker-denies vaping-denies alcohol use-denies any illicit drug use.  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  She has dogs cats and birds at home.  She does not have any kids.  ROS All systems were reviewed and are negative except for the above.  Objective:   Vitals:   10/02/23 0949  BP: 120/82  Pulse: 73  Temp: (!) 96.9 F (36.1 C)  SpO2: 100%  Weight: 161 lb 9.6 oz (73.3 kg)  Height: 5\' 5"  (1.651 m)   100% on RA BMI Readings from Last 3 Encounters:  10/02/23 26.89 kg/m  08/01/23 26.53 kg/m  07/20/23 24.96 kg/m   Wt Readings from Last 3 Encounters:  10/02/23 161 lb 9.6 oz (73.3 kg)  08/01/23 159 lb 6.4 oz  (72.3 kg)  07/20/23 150 lb (68 kg)    Physical Exam GEN: NAD, Healthy Appearing HEENT: Supple Neck, Reactive Pupils, EOMI, erythematous nasal mucosa.  CVS: Normal S1, Normal S2, RRR, No murmurs or ES appreciated  Lungs: Poor air movement.  But otherwise clear. Abdomen: Soft, non tender, non distended, + BS  Extremities: Warm and well perfused, No edema  Skin: No suspicious lesions appreciated  Psych: Normal Affect  Ancillary Information   CBC    Component Value Date/Time   WBC 7.0 07/20/2023 0921   RBC 4.65 07/20/2023 0921   HGB 14.1 07/20/2023 0921   HGB 13.5 11/15/2018 1209   HCT 42.5 07/20/2023 0921   HCT 39.3 11/15/2018 1209   PLT 152 07/20/2023 0921   PLT 202 11/15/2018 1209   MCV 91.4 07/20/2023 0921   MCV 92 11/15/2018 1209   MCH 30.3 07/20/2023 0921   MCHC 33.2 07/20/2023 0921   RDW 13.3 07/20/2023 0921   RDW 13.2 11/15/2018 1209   LYMPHSABS 1.3 07/06/2023 1316   LYMPHSABS 2.0 11/15/2018 1209   MONOABS 0.5 07/06/2023 1316   EOSABS 0.3 07/06/2023 1316   EOSABS 0.2 11/15/2018 1209   BASOSABS 0.0 07/06/2023 1316   BASOSABS 0.0 11/15/2018 1209    Labs and imaging were reviewed.     No data to display           Assessment & Plan:  Ms.  Burling is a 48 year old female patient with a past medical history of intermittent asthma presenting today to the pulmonary clinic to establish care.  # Severe persistent asthma #Allergic rhinitis.  EOS 200 - unable to obtain [place]. - Chest x-ray normal IgE 85 - Allergen panel mildly positive for grass and dustmites  []  Obtain PFTs []  Start Trelegy 200 1 puff daily []  Continue with albuterol on an as-needed basis. []  Discussed starting dupixent, she is interested but wanted to read about it. []  Flonase 1spray each nostril daily.    RTC 3 months  I spent 35 minutes caring for this patient today, including preparing to see the patient, obtaining a medical history , reviewing a separately obtained history, performing a  medically appropriate examination and/or evaluation, counseling and educating the patient/family/caregiver, ordering medications, tests, or procedures, documenting clinical information in the electronic health record, and independently interpreting results (not separately reported/billed) and communicating results to the patient/family/caregiver  Janann Colonel, MD  Pulmonary Critical Care 10/02/2023 9:56 AM

## 2023-10-17 ENCOUNTER — Other Ambulatory Visit: Payer: Self-pay | Admitting: Pulmonary Disease

## 2023-10-17 NOTE — Telephone Encounter (Signed)
 Last Fill: 07/06/23  Last OV: 08/01/23 Next OV: 01/17/24  Routing to provider for review/authorization.

## 2023-10-17 NOTE — Telephone Encounter (Signed)
 Copied from CRM [id number]. Topic: Clinical - Medication Refill >> Oct 17, 2023  1:04 PM Hilton Lucky wrote: Most Recent Primary Care Visit:   Medication: albuterol  (VENTOLIN  HFA) 108 (90 Base) MCG/ACT inhaler  Has the patient contacted their pharmacy? No  Is this the correct pharmacy for this prescription? Yes  This is the patient's preferred pharmacy:  [facility] [phone] [place] - [address] [place] [address] Phone: [phone] Fax: [phone]  Has the prescription been filled recently? No  Is the patient out of the medication? No  Has the patient been seen for an appointment in the last year OR does the patient have an upcoming appointment? Yes  Can we respond through MyChart? Yes  Agent: Please be advised that Rx refills may take up to 3 business days. We ask that you follow-up with your pharmacy.

## 2023-10-19 ENCOUNTER — Telehealth: Payer: Self-pay

## 2023-10-19 ENCOUNTER — Other Ambulatory Visit (HOSPITAL_COMMUNITY): Payer: Self-pay

## 2023-10-19 DIAGNOSIS — J4550 Severe persistent asthma, uncomplicated: Secondary | ICD-10-CM

## 2023-10-19 MED ORDER — ALBUTEROL SULFATE HFA 108 (90 BASE) MCG/ACT IN AERS: 2.0000 | INHALATION_SPRAY | Freq: Four times a day (QID) | RESPIRATORY_TRACT | 11 refills | Status: DC | PRN

## 2023-10-19 NOTE — Telephone Encounter (Signed)
 Copied from CRM [id number]. Topic: Clinical - Medication Question >> Oct 17, 2023  1:06 PM Hilton Lucky wrote: Reason for CRM: Patient states would like to try to do the dupixent shot. Please reach out to patient regarding information about scheduling or process going forward.   Routing to Dr Lucina Sabal.

## 2023-10-19 NOTE — Telephone Encounter (Signed)
 Patient states the Trelegy that was sent in needs a prior authorization.  Prior auth team please advise. Thank you!

## 2023-10-19 NOTE — Telephone Encounter (Signed)
 PA request has been Submitted. New Encounter has been or will be created for follow up. For additional info see Pharmacy Prior Auth telephone encounter from 04/24.

## 2023-10-19 NOTE — Telephone Encounter (Signed)
 I have sent in the Albuterol  and notified the patient. She said she does not want to discuss the Dupixent at this time. She had some places come up on her skin, but she put bleach on them and it went aways. So she does not want to start any injections right now.  She did mention the Trelegy needs a Prior-authorization. I have opened another telephone encounter regarding that.  Nothing further needed.

## 2023-10-19 NOTE — Addendum Note (Signed)
 Addended by: Pepper Boyer on: 10/19/2023 02:45 PM   Modules accepted: Orders

## 2023-10-19 NOTE — Telephone Encounter (Signed)
*  Pulm  Pharmacy Patient Advocate Encounter   Received notification from Pt Calls Messages that prior authorization for Trelegy is required/requested.   Insurance verification completed.   The patient is insured through [facility] .   Per test claim: PA required; PA submitted to above mentioned insurance via CoverMyMeds Key/confirmation #/EOC [id number] Status is pending

## 2023-10-20 ENCOUNTER — Ambulatory Visit: Attending: Otolaryngology

## 2023-10-20 DIAGNOSIS — G4733 Obstructive sleep apnea (adult) (pediatric): Secondary | ICD-10-CM | POA: Insufficient documentation

## 2023-10-20 DIAGNOSIS — J4550 Severe persistent asthma, uncomplicated: Secondary | ICD-10-CM | POA: Diagnosis present

## 2023-10-20 NOTE — Telephone Encounter (Signed)
 See second telephone encounter from 34/24.  Closing this encounter.

## 2023-10-26 MED ORDER — MOMETASONE FURO-FORMOTEROL FUM 200-5 MCG/ACT IN AERO: 2.0000 | INHALATION_SPRAY | Freq: Two times a day (BID) | RESPIRATORY_TRACT | 3 refills | Status: DC

## 2023-10-26 MED ORDER — INCRUSE ELLIPTA 62.5 MCG/ACT IN AEPB: 1.0000 | INHALATION_SPRAY | Freq: Every day | RESPIRATORY_TRACT | 3 refills | Status: DC

## 2023-10-26 NOTE — Addendum Note (Signed)
 Addended by: Annitta Kindler on: 10/26/2023 07:10 PM   Modules accepted: Orders

## 2023-10-27 ENCOUNTER — Encounter: Payer: Self-pay | Admitting: Pulmonary Disease

## 2023-10-27 NOTE — Telephone Encounter (Signed)
 Dr. Lucina Sabal her sleep study has been scanned into her chart for you to review.

## 2023-10-27 NOTE — Telephone Encounter (Signed)
 Copied from CRM [id number]. Topic: General - Other >> Oct 27, 2023 11:39 AM Crist Dominion wrote: Reason for CRM: Patient states she missed a call from [place] ad is requesting a call back when she is available.

## 2023-10-27 NOTE — Telephone Encounter (Signed)
 I have notified the patient.   She is asking for her sleep study results that she had done. I told her I did not see the results in the system and we would let her know once Dr. Lucina Sabal has a chance to review the results.

## 2023-10-27 NOTE — Telephone Encounter (Signed)
 I have notified the patient of her sleep study results. She said she has been experiencing some Insomina.

## 2023-10-27 NOTE — Telephone Encounter (Signed)
 Dr. Kieran Pellet can you see if you have her in lab sleep study to review?

## 2023-10-27 NOTE — Telephone Encounter (Signed)
 Per Dr. Kieran Pellet, patient has Mild Sleep Apnea. She would recommend CPAP, but we will wait on Dr. Lucina Sabal response.    LMTCB. E2C2 please advise when patient calls back.

## 2023-11-23 ENCOUNTER — Encounter: Admitting: Obstetrics and Gynecology

## 2023-12-11 DIAGNOSIS — N390 Urinary tract infection, site not specified: Secondary | ICD-10-CM | POA: Diagnosis not present

## 2023-12-19 ENCOUNTER — Ambulatory Visit: Payer: Self-pay

## 2023-12-19 NOTE — Telephone Encounter (Signed)
 FYI Only or Action Required?: FYI only for provider.  Patient was last seen in primary care on Called Nurse Triage reporting Generalized Body Pain. Symptoms began several months ago. Interventions attempted: OTC medications: Naproxen, Rest, hydration, or home remedies, and Ice/heat application. Symptoms are: unchanged.  Triage Disposition: See HCP Within 4 Hours (Or PCP Triage)-patient instructed that ED is always an option but doesn't want to go at this time  Patient/caregiver understands and will follow disposition?: No, refuses disposition  Copied from CRM [id number]. Topic: Clinical - Red Word Triage >> Dec 19, 2023  1:05 PM Tiffini S wrote: [facility] that prompted transfer to Nurse Triage: Patient is having severe arthritis/ joint symptoms for feet, knees, back, and buttock are very painful. Having a new patient scheduled for 01/01/24 at 1:20pm. Reason for Disposition  [1] SEVERE pain (e.g., excruciating, unable to do any normal activities) AND [2] not improved 2 hours after pain medicine  Answer Assessment - Initial Assessment Questions 1. ONSET: When did the muscle aches or body pains start?      Chronic body pain 2. LOCATION: What part of your body is hurting? (e.g., entire body, arms, legs)      Feet, knees, back, buttocks 3. SEVERITY: How bad is the pain? (Scale 1-10; or mild, moderate, severe)   - MILD (1-3): doesn't interfere with normal activities    - MODERATE (4-7): interferes with normal activities or awakens from sleep    - SEVERE (8-10):  excruciating pain, unable to do any normal activities      10 4. CAUSE: What do you think is causing the pains?     Possible arthritis 5. FEVER: Have you been having fever?     no 6. OTHER SYMPTOMS: Do you have any other symptoms? (e.g., chest pain, weakness, rash, cold or flu symptoms, weight loss)     no 8. TRAVEL: Have you traveled out of the country in the last month? (e.g., travel history, exposures)     no  Protocols  used: Muscle Aches and Body Pain-A-AH

## 2024-01-01 ENCOUNTER — Ambulatory Visit (INDEPENDENT_AMBULATORY_CARE_PROVIDER_SITE_OTHER): Admitting: Family Medicine

## 2024-01-01 VITALS — BP 126/74 | HR 63 | Resp 16 | Ht 65.0 in | Wt 155.0 lb

## 2024-01-01 DIAGNOSIS — F419 Anxiety disorder, unspecified: Secondary | ICD-10-CM | POA: Diagnosis not present

## 2024-01-01 DIAGNOSIS — Z1159 Encounter for screening for other viral diseases: Secondary | ICD-10-CM

## 2024-01-01 DIAGNOSIS — M791 Myalgia, unspecified site: Secondary | ICD-10-CM

## 2024-01-01 DIAGNOSIS — I10 Essential (primary) hypertension: Secondary | ICD-10-CM

## 2024-01-01 DIAGNOSIS — Z0000 Encounter for general adult medical examination without abnormal findings: Secondary | ICD-10-CM | POA: Diagnosis not present

## 2024-01-01 DIAGNOSIS — J45901 Unspecified asthma with (acute) exacerbation: Secondary | ICD-10-CM | POA: Diagnosis not present

## 2024-01-01 DIAGNOSIS — M069 Rheumatoid arthritis, unspecified: Secondary | ICD-10-CM | POA: Diagnosis not present

## 2024-01-01 DIAGNOSIS — R21 Rash and other nonspecific skin eruption: Secondary | ICD-10-CM | POA: Diagnosis not present

## 2024-01-01 DIAGNOSIS — M5030 Other cervical disc degeneration, unspecified cervical region: Secondary | ICD-10-CM

## 2024-01-01 DIAGNOSIS — F32A Depression, unspecified: Secondary | ICD-10-CM | POA: Diagnosis not present

## 2024-01-01 DIAGNOSIS — J45909 Unspecified asthma, uncomplicated: Secondary | ICD-10-CM | POA: Diagnosis not present

## 2024-01-01 DIAGNOSIS — M2550 Pain in unspecified joint: Secondary | ICD-10-CM | POA: Diagnosis not present

## 2024-01-01 MED ORDER — PREDNISONE 20 MG PO TABS: ORAL_TABLET | ORAL | 0 refills | Status: DC

## 2024-01-01 MED ORDER — IPRATROPIUM-ALBUTEROL 0.5-2.5 (3) MG/3ML IN SOLN: 3.0000 mL | Freq: Four times a day (QID) | RESPIRATORY_TRACT | 1 refills | Status: DC | PRN

## 2024-01-01 MED ORDER — NEBULIZER/TUBING/MOUTHPIECE KIT: PACK | 0 refills | Status: AC

## 2024-01-01 MED ORDER — AMLODIPINE BESYLATE 5 MG PO TABS: 5.0000 mg | ORAL_TABLET | Freq: Every day | ORAL | 1 refills | Status: AC

## 2024-01-01 NOTE — Progress Notes (Signed)
 Name: Autumn Lewis   MRN: [id number]    DOB: Jun 16, 1976   Date:01/01/2024       Progress Note  Chief Complaint  Patient presents with   Establish Care   Hypertension   Asthma     Subjective:   Autumn Lewis is a 48 y.o. female, presents to clinic to est care and continue follow up on chronic conditions  Hx of HTN and asthma  Other major concerns is insomnia, uncontrolled asthma (seeing pulmonology currently [place] [facility]), body aches rashes - she has pain all over her body possibly dx in the past with RA ([facility] in [place] ) and cervical DDD  Asthma only well controlled on Breo in the past and she can't get that here - coughing, SOB, using albuterol  rescue inhaler frequently with brief relief. She doesn't have neb machine. She feels like she needs steroids Currently has advair and it doesn't work  Moods/depression was something she wants to address and improve asks for psychiatry referral  Disabled, unable to work, living with her mom and environment is unstable with tension and she can stay or she can't stay with them She was on meds in the past but doesn't remember what they were    01/01/2024    1:16 PM  Depression screen PHQ 2/9  Decreased Interest 3  Down, Depressed, Hopeless 3  PHQ - 2 Score 6  Altered sleeping 3  Tired, decreased energy 3  Change in appetite 0  Feeling bad or failure about yourself  3  Trouble concentrating 3  Moving slowly or fidgety/restless 0  Suicidal thoughts 1  PHQ-9 Score 19      01/01/2024    1:22 PM  GAD 7 : Generalized Anxiety Score  Nervous, Anxious, on Edge 3  Control/stop worrying 3  Worry too much - different things 3  Trouble relaxing 3  Restless 3  Easily annoyed or irritable 3  Afraid - awful might happen 3  Total GAD 7 Score 21            Current Outpatient Medications:    albuterol  (VENTOLIN  HFA) 108 (90 Base) MCG/ACT inhaler, Inhale 2 puffs into the lungs every 6 (six) hours as needed for wheezing or  shortness of breath., Disp: 18 g, Rfl: 11   amLODipine  (NORVASC ) 5 MG tablet, Take 1 tablet (5 mg total) by mouth daily., Disp: 30 tablet, Rfl: 11   loratadine  (CLARITIN ) 10 MG tablet, Take 10 mg by mouth daily., Disp: , Rfl:    mometasone  (NASONEX ) 50 MCG/ACT nasal spray, 2 sprays each nostril daily., Disp: 17 g, Rfl: 11   mometasone -formoterol  (DULERA) 200-5 MCG/ACT AERO, Inhale 2 puffs into the lungs 2 (two) times daily., Disp: 3 each, Rfl: 3   montelukast  (SINGULAIR ) 10 MG tablet, Take 1 tablet (10 mg total) by mouth at bedtime., Disp: 30 tablet, Rfl: 11   Spacer/Aero-Holding Chambers (AEROCHAMBER MV) inhaler, Use as instructed, Disp: 1 each, Rfl: 0   triamcinolone  ointment (KENALOG ) 0.1 %, Apply 1 Application topically 2 (two) times daily., Disp: 80 g, Rfl: 1   ipratropium-albuterol  (DUONEB) 0.5-2.5 (3) MG/3ML SOLN, Take 3 mLs by nebulization every 4 (four) hours as needed. (Patient not taking: Reported on 01/01/2024), Disp: 360 mL, Rfl: 1   umeclidinium bromide  (INCRUSE ELLIPTA ) 62.5 MCG/ACT AEPB, Inhale 1 puff into the lungs daily. (Patient not taking: Reported on 01/01/2024), Disp: 3 each, Rfl: 3  Patient Active Problem List   Diagnosis Date Noted   Preexisting hypertension complicating pregnancy, antepartum  08/29/2023   AMA (advanced maternal age) multigravida 35+ 08/29/2023   Supervision of high risk pregnancy, antepartum 08/29/2023   Chronic asthma 07/20/2023   Essential hypertension 11/19/2018   DDD (degenerative disc disease), cervical 11/19/2018    History reviewed. No pertinent surgical history.  Family History  Problem Relation Age of Onset   Hypertension Father    Heart disease Father        Had chest pain in day before he was found dead   Diabetes Mellitus II Maternal Grandfather    Stroke Mother 31   Hypertension Mother    Sleep apnea Mother     Social History   Tobacco Use   Smoking status: Never   Smokeless tobacco: Never  Vaping Use   Vaping status: Never  Used  Substance Use Topics   Alcohol use: Yes    Comment: wine occasionally   Drug use: Never    Comment: Only thing she smoked was MJ for 26 years, stopping with diagnosis of asthma at age 42     Allergies  Allergen Reactions   Strawberry Extract Anaphylaxis   Milk-Related Compounds Diarrhea   Pork-Derived Products Hives   Shellfish Allergy Other (See Comments)    Was told not to eat after allergy tests   Tomato    Tree Extract Other (See Comments)    Pt states unknown reation    Health Maintenance  Topic Date Due   Hepatitis C Screening  Never done   Cervical Cancer Screening (HPV/Pap Cotest)  Never done   Colonoscopy  Never done   COVID-19 Vaccine (1 - 2024-25 season) 01/13/2024 (Originally 02/26/2023)   Pneumococcal Vaccine 56-40 Years old (1 of 2 - PCV) 12/27/2024 (Originally 07/11/1993)   Hepatitis B Vaccines (1 of 3 - 19+ 3-dose series) 12/27/2024 (Originally 07/11/1993)   INFLUENZA VACCINE  01/26/2024   DTaP/Tdap/Td (2 - Td or Tdap) 11/26/2027   HIV Screening  Completed   HPV VACCINES  Aged Out   Meningococcal B Vaccine  Aged Out    Chart Review Today: I personally reviewed active problem list, medication list, allergies, family history, social history, health maintenance, notes from last encounter, lab results, imaging with the patient/caregiver today.   Review of Systems  Constitutional: Negative.   HENT: Negative.    Eyes: Negative.   Respiratory: Negative.    Cardiovascular: Negative.   Gastrointestinal: Negative.   Endocrine: Negative.   Genitourinary: Negative.   Musculoskeletal: Negative.   Skin: Negative.   Allergic/Immunologic: Negative.   Neurological: Negative.   Hematological: Negative.   Psychiatric/Behavioral: Negative.    All other systems reviewed and are negative.    Objective:   Vitals:   01/01/24 1316  BP: 126/74  Pulse: 63  Resp: 16  SpO2: 97%  Weight: 155 lb (70.3 kg)  Height: 5' 5 (1.651 m)    Body mass index is 25.79  kg/m.  Physical Exam Vitals and nursing note reviewed.  Constitutional:      General: She is not in acute distress.    Appearance: Normal appearance. She is well-developed. She is not ill-appearing, toxic-appearing or diaphoretic.  HENT:     Head: Normocephalic and atraumatic.     Right Ear: External ear normal.     Left Ear: External ear normal.     Nose: Nose normal.  Eyes:     General: No scleral icterus.       Right eye: No discharge.        Left eye: No discharge.  Conjunctiva/sclera: Conjunctivae normal.  Neck:     Trachea: No tracheal deviation.  Cardiovascular:     Rate and Rhythm: Normal rate and regular rhythm.     Pulses: Normal pulses.     Heart sounds: Normal heart sounds.  Pulmonary:     Effort: Pulmonary effort is normal. No respiratory distress.     Breath sounds: No stridor. Wheezing present. No rhonchi or rales.  Musculoskeletal:     Right lower leg: No edema.     Left lower leg: No edema.  Skin:    General: Skin is warm and dry.     Findings: Rash present.  Neurological:     Mental Status: She is alert. Mental status is at baseline.     Motor: No abnormal muscle tone.     Coordination: Coordination normal.     Gait: Gait normal.  Psychiatric:        Mood and Affect: Mood normal.        Behavior: Behavior normal.      Functional Status Survey: Is the patient deaf or have difficulty hearing?: No Does the patient have difficulty seeing, even when wearing glasses/contacts?: No Does the patient have difficulty concentrating, remembering, or making decisions?: No Does the patient have difficulty walking or climbing stairs?: Yes Does the patient have difficulty dressing or bathing?: No Does the patient have difficulty doing errands alone such as visiting a doctor's office or shopping?: No Results for orders placed or performed during the hospital encounter of 08/01/23  Allergen Panel (27) + IGE   Collection Time: 08/01/23  1:58 PM  Result Value Ref  Range   Class Description Allergens Comment    IgE (Immunoglobulin E), Serum 85 6 - 495 IU/mL   D Pteronyssinus IgE 0.92 (A) Class II kU/L   D Farinae IgE 1.24 (A) Class II kU/L   Cat Dander IgE <0.10 Class 0 kU/L   Dog Dander IgE <0.10 Class 0 kU/L   [place] Grass IgE <0.10 Class 0 kU/L   Timothy Grass IgE 0.52 (A) Class I kU/L   [place]  Bluegrass IgE 0.76 (A) Class II kU/L   [place] Grass IgE <0.10 Class 0 kU/L   B[place]ia Grass IgE 0.15 (A) Class 0/I kU/L   Cockroach, American IgE 0.38 (A) Class I kU/L   Penicillium Chrysogen IgE <0.10 Class 0 kU/L   Cladosporium Herbarum IgE <0.10 Class 0 kU/L   Aspergillus Fumigatus IgE <0.10 Class 0 kU/L   Mucor Racemosus IgE <0.10 Class 0 kU/L   Alternaria Alternata IgE 0.80 (A) Class II kU/L   Setomelanomma Rostrat <0.10 Class 0 kU/L   Oak, White IgE 0.52 (A) Class I kU/L   Elm, American IgE <0.10 Class 0 kU/L   Maple/Box Elder IgE <0.10 Class 0 kU/L   Common Silver Valrie IgE 0.45 (A) Class I kU/L   Hickory, White IgE <0.10 Class 0 kU/L   White Mulberry IgE <0.10 Class 0 kU/L   Cedar, [place] IgE <0.10 Class 0 kU/L   Ragweed, Short IgE 0.32 (A) Class I kU/L   Plantain, English IgE <0.10 Class 0 kU/L   Cocklebur IgE 0.11 (A) Class 0/I kU/L   Pigweed, Rough IgE <0.10 Class 0 kU/L      Assessment & Plan:      ICD-10-CM   1. Encounter for medical examination to establish care  Z00.00    req records from [place]  PCP and specialists, new to est care, seeing pulmolonogy locally    2. Essential hypertension  I10 Comprehensive Metabolic Panel (CMET)    amLODipine  (NORVASC ) 5 MG tablet   stable, well controlled today, BP near goal and she didn't take her amlodipine  yet, refilled same med/dose, continue med/DASH    3. Chronic asthma, unspecified asthma severity, unspecified whether complicated, unspecified whether persistent  J45.909 CBC with Differential/Platelet    ipratropium-albuterol  (DUONEB) 0.5-2.5 (3) MG/3ML SOLN   per pulm    4.  DDD (degenerative disc disease), cervical  M50.30    reports neck and body pain for at least 6 years    5. Screening for viral disease  Z11.59 Hepatitis C Antibody    6. Anxiety and depression  F41.9 Ambulatory referral to Psychiatry   F32.A    high phq 9 and GAD 7, not new, she wishes to est with psych and get therapy, shes tried meds in the past can't remember what meds    7. Rash and other nonspecific skin eruption  R21 CBC with Differential/Platelet    Comprehensive Metabolic Panel (CMET)    Analyzer ANA IFA w/RFLX Titer/Pattern,Systemic Autoimmune Panel 1    Ambulatory referral to Dermatology   patches/plaques to various places on body chest, hands, elbow, ankle/feet, unclear if eczema or related to other possible autoimmune dx    8. Myalgia  M79.10 Analyzer ANA IFA w/RFLX Titer/Pattern,Systemic Autoimmune Panel 1    9. Polyarthralgia  M25.50 Analyzer ANA IFA w/RFLX Titer/Pattern,Systemic Autoimmune Panel 1   pain all over her body except her face for many years, reports dx of RA in the past and seeing specialists in [place] , needs to est locally    10. Exacerbation of asthma, unspecified asthma severity, unspecified whether persistent  J45.901 predniSONE  (DELTASONE ) 20 MG tablet    ipratropium-albuterol  (DUONEB) 0.5-2.5 (3) MG/3ML SOLN   she feels like maintenence inhalers aren't working, cough/wheeze SOB, feels like she needs prednisone  because shes using albuterol  frequently    11. Rheumatoid arthritis, involving unspecified site, unspecified whether rheumatoid factor present (HCC)  M06.9    per pt report from specialist in [place] , body aches, pain, fatigue, rashes     Plan to get labs and records Tx current pulm sx with prednisone  and coordinate with her pulmonologist before changing other meds, but hope to get neb machine and nebs available Autoimmune testing with her hx and given hx She wants to be referred to psych - contact info given on AVS and pt encouraged to call to  get new pt appt scheduled - referral placed Close f/up to review lab/sx/referrals  Return in about 1 month (around 02/01/2024) for f/up labs/sx/referrals .   Michelene Cower, PA-C 01/01/24 1:49 PM

## 2024-01-01 NOTE — Patient Instructions (Signed)
 [facility] ([facility]) [address] [place],  [place]  [address] Main: [phone] Fax: [phone]   Call [facility] to make a new patient appointment.

## 2024-01-03 ENCOUNTER — Ambulatory Visit: Payer: Self-pay | Admitting: Family Medicine

## 2024-01-07 LAB — CBC WITH DIFFERENTIAL/PLATELET
Absolute Lymphocytes: 1934 {cells}/uL (ref 850–3900)
Absolute Monocytes: 442 {cells}/uL (ref 200–950)
Basophils Absolute: 33 {cells}/uL (ref 0–200)
Basophils Relative: 0.5 %
Eosinophils Absolute: 224 {cells}/uL (ref 15–500)
Eosinophils Relative: 3.4 %
HCT: 46.2 % — ABNORMAL HIGH (ref 35.0–45.0)
Hemoglobin: 14.6 g/dL (ref 11.7–15.5)
MCH: 29.6 pg (ref 27.0–33.0)
MCHC: 31.6 g/dL — ABNORMAL LOW (ref 32.0–36.0)
MCV: 93.5 fL (ref 80.0–100.0)
MPV: 10.2 fL (ref 7.5–12.5)
Monocytes Relative: 6.7 %
Neutro Abs: 3967 {cells}/uL (ref 1500–7800)
Neutrophils Relative %: 60.1 %
Platelets: 209 Thousand/uL (ref 140–400)
RBC: 4.94 Million/uL (ref 3.80–5.10)
RDW: 13.1 % (ref 11.0–15.0)
Total Lymphocyte: 29.3 %
WBC: 6.6 Thousand/uL (ref 3.8–10.8)

## 2024-01-07 LAB — COMPREHENSIVE METABOLIC PANEL WITH GFR
AG Ratio: 1.6 (calc) (ref 1.0–2.5)
ALT: 15 U/L (ref 6–29)
AST: 17 U/L (ref 10–35)
Albumin: 4.3 g/dL (ref 3.6–5.1)
Alkaline phosphatase (APISO): 86 U/L (ref 31–125)
BUN: 15 mg/dL (ref 7–25)
CO2: 28 mmol/L (ref 20–32)
Calcium: 9.5 mg/dL (ref 8.6–10.2)
Chloride: 102 mmol/L (ref 98–110)
Creat: 0.84 mg/dL (ref 0.50–0.99)
Globulin: 2.7 g/dL (ref 1.9–3.7)
Glucose, Bld: 86 mg/dL (ref 65–99)
Potassium: 4.2 mmol/L (ref 3.5–5.3)
Sodium: 138 mmol/L (ref 135–146)
Total Bilirubin: 0.8 mg/dL (ref 0.2–1.2)
Total Protein: 7 g/dL (ref 6.1–8.1)
eGFR: 85 mL/min/1.73m2 (ref >=60)

## 2024-01-07 LAB — ANALYZER(R)ANA IFA WITH REFLEX TITER/PATTRN,SYS AUTOIMM PNL1
Anti Nuclear Antibody (ANA): NEGATIVE
Anticardiolipin IgA: <2 [APL'U]/mL
Anticardiolipin IgG: <2 [GPL'U]/mL
Anticardiolipin IgM: 10.7 [MPL'U]/mL
Beta-2 Glyco 1 IgA: 2.4 U/mL
Beta-2 Glyco 1 IgM: 22.6 U/mL — ABNORMAL HIGH
Beta-2 Glyco I IgG: 4.5 U/mL
C3 Complement: 119 mg/dL (ref 83–193)
C4 Complement: 27 mg/dL (ref 15–57)
Centromere Ab Screen: <1 AI
Chromatin (Nucleosomal) Antibody: <1 AI
Cyclic Citrullin Peptide Ab: <16 U
DNA Ab (DS) Crithidia, IFA: NEGATIVE
ENA SM Ab Ser-aCnc: <1 AI
Jo-1 Autoabs: <1 AI
MUTATED CITRULLINATED VIMENTIN (MCV) AB: <20 U/mL (ref <20)
Rheumatoid Factor (IgA): <5 U
Rheumatoid Factor (IgG): <5 U
Rheumatoid Factor (IgM): 6 U
Ribonucleic Protein(ENA) Antibody, IgG: <1 AI
SM/RNP: <1 AI
SSA (Ro) (ENA) Antibody, IgG: <1 AI
SSB (La) (ENA) Antibody, IgG: <1 AI
Scleroderma (Scl-70) (ENA) Antibody, IgG: <1 AI
Thyroperoxidase Ab SerPl-aCnc: <1 [IU]/mL (ref <9)

## 2024-01-07 LAB — HEPATITIS C ANTIBODY: Hepatitis C Ab: NONREACTIVE

## 2024-01-09 DIAGNOSIS — J45909 Unspecified asthma, uncomplicated: Secondary | ICD-10-CM | POA: Diagnosis not present

## 2024-01-17 ENCOUNTER — Telehealth: Payer: Self-pay

## 2024-01-17 ENCOUNTER — Ambulatory Visit: Admitting: Pulmonary Disease

## 2024-01-17 ENCOUNTER — Encounter: Payer: Self-pay | Admitting: Pulmonary Disease

## 2024-01-17 VITALS — BP 138/86 | HR 74 | Temp 97.3°F | Ht 65.0 in | Wt 156.6 lb

## 2024-01-17 DIAGNOSIS — J4550 Severe persistent asthma, uncomplicated: Secondary | ICD-10-CM | POA: Diagnosis not present

## 2024-01-17 MED ORDER — BREZTRI AEROSPHERE 160-9-4.8 MCG/ACT IN AERO
2.0000 | INHALATION_SPRAY | Freq: Two times a day (BID) | RESPIRATORY_TRACT | 6 refills | Status: DC
Start: 2024-01-17 — End: 2024-02-15

## 2024-01-17 NOTE — Telephone Encounter (Signed)
 Received DMW paperwork via fax Azzie). Pharmacy team will start BIV

## 2024-01-17 NOTE — Progress Notes (Signed)
 Synopsis: Referred in by No ref. provider found   Subjective:   PATIENT ID: Autumn Lewis GENDER: female DOB: [birth year], MRN: [id number]  Chief Complaint  Patient presents with   Follow-up    SOB. Wheezing. Dry cough.     HPI Autumn Lewis is a 48 year old female patient with a past medical history of intermittent asthma presenting today to the pulmonary clinic to establish care.  She was diagnosed with asthma 10 years ago and has been on albuterol  on an as-needed basis.  She was recently hospitalized from January 21 to January 25 with an asthma exacerbation and was discharged home on Breo.  She currently feels better however she still have difficulty breathing and using her rescue inhaler on a daily basis.  I think she understood that she needs to use Breo on an as-needed basis as well and has been using it that way.  She was never been hospitalized for an asthma exacerbation in the past.  She does report hypersensitivity to humidity cold air and strong scents.  She is not happy with advair and still having wheezing and chest tightness.  Allergen panel was mildly positive for mostly dustmites and grass.   Chest x-ray 07/20/2023 -no active cardiopulmonary disease. Family history -grandmother with asthma  Social history -never smoker-denies vaping-denies alcohol use-denies any illicit drug use.  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  She has dogs cats and birds at home.  She does not have any kids.  OV 01/17/2024 - Autumn Lewis is here to follow up regarding her asthma. She remains with significant symptoms ACT 12 . Using her rescue inhaler multiple times per day despite being on high dose ICS. We will try to get her on breztri  and escalate treatment to Biologics (Dupixent) which will help with her asthma as well. She was also found to have obstructive sleep apnea and is agreeable to start CPAP thereapy with nasal pillows.   ROS All systems were  reviewed and are negative except for the above.  Objective:   Vitals:   01/17/24 1400  BP: 138/86  Pulse: 74  Temp: (!) 97.3 F (36.3 C)  SpO2: 99%  Weight: 156 lb 9.6 oz (71 kg)  Height: 5' 5 (1.651 m)   99% on RA BMI Readings from Last 3 Encounters:  01/17/24 26.06 kg/m  01/01/24 25.79 kg/m  10/02/23 26.89 kg/m   Wt Readings from Last 3 Encounters:  01/17/24 156 lb 9.6 oz (71 kg)  01/01/24 155 lb (70.3 kg)  10/02/23 161 lb 9.6 oz (73.3 kg)    Physical Exam GEN: NAD, Healthy Appearing HEENT: Supple Neck, Reactive Pupils, EOMI, erythematous nasal mucosa.  CVS: Normal S1, Normal S2, RRR, No murmurs or ES appreciated  Lungs: Poor air movement and diffuse expiratory wheezing.  Abdomen: Soft, non tender, non distended, + BS  Extremities: Warm and well perfused, No edema  Skin: No suspicious lesions appreciated  Psych: Normal Affect  Ancillary Information   CBC    Component Value Date/Time   WBC 6.6 01/01/2024 1428   RBC 4.94 01/01/2024 1428   HGB 14.6 01/01/2024 1428   HGB 13.5 11/15/2018 1209   HCT 46.2 (H) 01/01/2024 1428   HCT 39.3 11/15/2018 1209   PLT 209 01/01/2024 1428   PLT 202 11/15/2018 1209   MCV 93.5 01/01/2024 1428   MCV 92 11/15/2018 1209   MCH 29.6 01/01/2024 1428   MCHC 31.6 (L) 01/01/2024 1428   RDW  13.1 01/01/2024 1428   RDW 13.2 11/15/2018 1209   LYMPHSABS 1.3 07/06/2023 1316   LYMPHSABS 2.0 11/15/2018 1209   MONOABS 0.5 07/06/2023 1316   EOSABS 224 01/01/2024 1428   EOSABS 0.2 11/15/2018 1209   BASOSABS 33 01/01/2024 1428   BASOSABS 0.0 11/15/2018 1209    Labs and imaging were reviewed.     No data to display           Assessment & Plan:  Autumn Lewis is a 48 year old female patient with a past medical history of intermittent asthma presenting today to the pulmonary clinic to establish care.  # Severe persistent asthma poorly controlled despite high dose ICS LABA #Allergic rhinitis.  EOS 200 - unable to obtain [place]. - Chest  x-ray normal IgE 85 - Allergen panel mildly positive for grass and dustmites  []  Obtain PFTs []  On high dose ICS-LABA for the last 5 months with minimal response. Will try to escalate to triple inhaler therapy.  []  Continue with albuterol  on an as-needed basis. []  Will escalate therapy and start her on Dupixent.  []  Flonase  1spray each nostril daily.    #Moderate OSA with AHI 14 []  Start auto cpap 5-15 cmH2O with HH and Nasal pillows. (She was not able to do the full face mask during titration therapy).   RTC 3 months  I spent 40 minutes caring for this patient today, including preparing to see the patient, obtaining a medical history , reviewing a separately obtained history, performing a medically appropriate examination and/or evaluation, counseling and educating the patient/family/caregiver, ordering medications, tests, or procedures, documenting clinical information in the electronic health record, and independently interpreting results (not separately reported/billed) and communicating results to the patient/family/caregiver  Darrin Barn, MD [facility] 01/17/2024 2:07 PM

## 2024-01-17 NOTE — Telephone Encounter (Signed)
 Vicci Evalene DEL, CMA  01/17/24  3:05 PM Note Patient was seen in the office today. Dr. Assaker has ordered Dupixent for the patient. She has signed the form and it has been faxed to the pharmacy team for completion.      Submitted a Prior Authorization request to [facility] for DUPIXENT via CoverMyMeds. Will update once we receive a response.  Key: [id number]

## 2024-01-17 NOTE — Telephone Encounter (Signed)
 Patient was seen in the office today. Dr. Larinda Buttery has ordered Dupixent for the patient. She has signed the form and it has been faxed to the pharmacy team for completion.

## 2024-01-19 ENCOUNTER — Other Ambulatory Visit (HOSPITAL_COMMUNITY): Payer: Self-pay

## 2024-01-19 NOTE — Telephone Encounter (Signed)
 Received notification from [facility] regarding a prior authorization for DUPIXENT. Authorization has been APPROVED from 01/18/2024 to 07/20/2024. Approval letter sent to scan center.  Per test claim, copay for 28 days supply is $0.00. This does NOT include loading dose.  Patient can fill through [facility]: [phone]   Authorization # [id number]   Will route to [place] for clinical outreach and scheduling new start visit.

## 2024-01-23 ENCOUNTER — Ambulatory Visit (INDEPENDENT_AMBULATORY_CARE_PROVIDER_SITE_OTHER)

## 2024-01-23 ENCOUNTER — Ambulatory Visit (HOSPITAL_COMMUNITY)
Admission: EM | Admit: 2024-01-23 | Discharge: 2024-01-23 | Disposition: A | Discharge status: Home / Self Care | Attending: Internal Medicine | Admitting: Internal Medicine

## 2024-01-23 ENCOUNTER — Encounter (HOSPITAL_COMMUNITY): Payer: Self-pay | Admitting: Emergency Medicine

## 2024-01-23 DIAGNOSIS — J4541 Moderate persistent asthma with (acute) exacerbation: Secondary | ICD-10-CM | POA: Diagnosis not present

## 2024-01-23 DIAGNOSIS — I10 Essential (primary) hypertension: Secondary | ICD-10-CM | POA: Diagnosis not present

## 2024-01-23 DIAGNOSIS — R0602 Shortness of breath: Secondary | ICD-10-CM | POA: Diagnosis not present

## 2024-01-23 LAB — POC SARS CORONAVIRUS 2 AG -  ED: SARS Coronavirus 2 Ag: NEGATIVE

## 2024-01-23 MED ORDER — IPRATROPIUM-ALBUTEROL 0.5-2.5 (3) MG/3ML IN SOLN
3.0000 mL | Freq: Once | RESPIRATORY_TRACT | Status: AC
  Administered 2024-01-23: 3 mL via RESPIRATORY_TRACT

## 2024-01-23 MED ORDER — PREDNISONE 20 MG PO TABS: 40.0000 mg | ORAL_TABLET | Freq: Every day | ORAL | 0 refills | Status: AC

## 2024-01-23 MED ORDER — METHYLPREDNISOLONE SODIUM SUCC 125 MG IJ SOLR
INTRAMUSCULAR | Status: AC
  Filled 2024-01-23: qty 2

## 2024-01-23 MED ORDER — ALBUTEROL SULFATE (2.5 MG/3ML) 0.083% IN NEBU
INHALATION_SOLUTION | RESPIRATORY_TRACT | Status: AC
  Filled 2024-01-23: qty 3

## 2024-01-23 MED ORDER — ALBUTEROL SULFATE (2.5 MG/3ML) 0.083% IN NEBU
2.5000 mg | INHALATION_SOLUTION | Freq: Once | RESPIRATORY_TRACT | Status: AC
  Administered 2024-01-23: 2.5 mg via RESPIRATORY_TRACT

## 2024-01-23 MED ORDER — METHYLPREDNISOLONE SODIUM SUCC 125 MG IJ SOLR
80.0000 mg | Freq: Once | INTRAMUSCULAR | Status: AC
  Administered 2024-01-23: 80 mg via INTRAMUSCULAR

## 2024-01-23 MED ORDER — ALBUTEROL SULFATE HFA 108 (90 BASE) MCG/ACT IN AERS: 2.0000 | INHALATION_SPRAY | Freq: Four times a day (QID) | RESPIRATORY_TRACT | 11 refills | Status: AC | PRN

## 2024-01-23 MED ORDER — IPRATROPIUM-ALBUTEROL 0.5-2.5 (3) MG/3ML IN SOLN
RESPIRATORY_TRACT | Status: AC
  Filled 2024-01-23: qty 3

## 2024-01-23 NOTE — Discharge Instructions (Addendum)
 Your symptoms are due to asthma attack. Chest x-ray is negative for pneumonia/concerning findings.  COVID-19 testing is negative here in the clinic.  - Use albuterol  every 4-6 hours as needed for cough, shortness of breath, and wheeze. -Continue using your Advair inhaler. - Take the steroid sent to the pharmacy as directed to help reduce lung inflammation and decrease the risk of another attack in the next few days. No NSAIDs like ibuprofen  or aleve/naproxen while taking steroid. Take with food to avoid stomach upset. - Take steroids starting tomorrow since I gave you a shot of Solu-Medrol  (steroid) in the clinic today.  Please take your blood pressure medication when you go home.  Your blood pressure was very high in the clinic.  If your symptoms do not improve in the next 2-3 days with interventions, please return. Please seek medical care for new or returning symptoms, such as difficulty breathing that doesn't improve with your medications, chest pain, voice changes, high fevers, confusion, or other new or worsening symptoms. Follow-up with PCP for ongoing management of asthma.

## 2024-01-23 NOTE — ED Provider Notes (Signed)
 [facility]    CSN: [id number] Arrival date & time: 01/23/24  1800      History   Chief Complaint Chief Complaint  Patient presents with   Shortness of Breath    HPI Autumn Lewis is a 48 y.o. female.   Autumn Lewis is a 48 y.o. female presenting for chief complaint of Shortness of Breath that started yesterday.  History of severe persistent asthma.  She has been using her Advair inhaler and albuterol  inhaler without much relief of coughing and shortness of breath.  Cough is mostly dry, nonproductive, and has been worse at nighttime.  Cough is associated with bilateral chest tightness.  No recent fevers, chills, leg swelling, orthopnea, nausea, vomiting, dizziness, ear pain, or rhinorrhea/nasal congestion.  She recently saw her pulmonologist 4 days ago who recommended use of Dupixent  to gain better control of asthma.  She has not received Dupixent  yet.  She had pneumonia in January 2025 requiring hospital admission due to new oxygen requirement.  Using Advair and albuterol  inhalers as stated above without much relief of shortness of breath.  Blood pressure is elevated in triage at 174/118.  She has a history of high blood pressure and states she has not taken her blood pressure medication today.    Shortness of Breath   Past Medical History:  Diagnosis Date   Asthma    BV (bacterial vaginosis) 02/2017   Chronic asthma 07/20/2023   DDD (degenerative disc disease), cervical 11/19/2018   Essential hypertension 11/19/2018   Hidradenitis suppurativa 12/19/2016   Hypertension    Influenza A with pneumonia 07/20/2023   Moderate persistent asthma without complication 11/19/2018   Right ovarian cyst 12/09/2016   Tooth abscess 03/07/2017    Patient Active Problem List   Diagnosis Date Noted   Preexisting hypertension complicating pregnancy, antepartum 08/29/2023   AMA (advanced maternal age) multigravida 35+ 08/29/2023   Supervision of high risk pregnancy,  antepartum 08/29/2023   Chronic asthma 07/20/2023   Essential hypertension 11/19/2018   DDD (degenerative disc disease), cervical 11/19/2018    History reviewed. No pertinent surgical history.  OB History     Gravida  0   Para  0   Term  0   Preterm  0   AB  0   Living  0      SAB  0   IAB  0   Ectopic  0   Multiple  0   Live Births  0            Home Medications    Prior to Admission medications   Medication Sig Start Date End Date Taking? Authorizing Provider  predniSONE  (DELTASONE ) 20 MG tablet Take 2 tablets (40 mg total) by mouth daily with breakfast for 5 days. 01/23/24 01/28/24 Yes StanhopeDorna HERO, FNP  albuterol  (VENTOLIN  HFA) 108 (90 Base) MCG/ACT inhaler Inhale 2 puffs into the lungs every 6 (six) hours as needed for wheezing or shortness of breath. 10/19/23   Assaker, Darrin, MD  amLODipine  (NORVASC ) 5 MG tablet Take 1 tablet (5 mg total) by mouth daily. 01/01/24 12/31/24  Tapia, Leisa, PA-C  budesonide-glycopyrrolate-formoterol  (BREZTRI  AEROSPHERE) 160-9-4.8 MCG/ACT AERO inhaler Inhale 2 puffs into the lungs 2 (two) times daily. 01/17/24   Assaker, Darrin, MD  fluticasone -salmeterol (ADVAIR) 500-50 MCG/ACT AEPB Inhale 1 puff into the lungs in the morning and at bedtime.    [provider]  ipratropium-albuterol  (DUONEB) 0.5-2.5 (3) MG/3ML SOLN Take 3 mLs by nebulization every 6 (six)  hours as needed. Patient not taking: Reported on 01/17/2024 01/01/24   Tapia, Leisa, PA-C  loratadine  (CLARITIN ) 10 MG tablet Take 10 mg by mouth daily.    [provider]  mometasone  (NASONEX ) 50 MCG/ACT nasal spray 2 sprays each nostril daily. 07/06/23   Menshew, Candida LULLA Kings, PA-C  montelukast  (SINGULAIR ) 10 MG tablet Take 1 tablet (10 mg total) by mouth at bedtime. Patient not taking: Reported on 01/17/2024 07/06/23   Menshew, Candida LULLA Kings, PA-C  Respiratory Therapy Supplies (NEBULIZER/TUBING/MOUTHPIECE) KIT Disp one nebulizer machine, tubing set  and mouthpiece kit 01/01/24   Tapia, Leisa, PA-C  Spacer/Aero-Holding Chambers (AEROCHAMBER MV) inhaler Use as instructed 07/06/23   Menshew, Candida LULLA Kings, PA-C  triamcinolone  ointment (KENALOG ) 0.1 % Apply 1 Application topically 2 (two) times daily. 07/06/23   Menshew, Candida LULLA Kings, PA-C  umeclidinium bromide  (INCRUSE ELLIPTA ) 62.5 MCG/ACT AEPB Inhale 1 puff into the lungs daily. Patient not taking: Reported on 01/17/2024 10/26/23   Malka Domino, MD    Family History Family History  Problem Relation Age of Onset   Stroke Mother 39   Hypertension Mother    Sleep apnea Mother    Hypertension Father    Heart disease Father        Had chest pain in day before he was found dead   COPD Maternal Grandmother    Arthritis Maternal Grandmother    Diabetes Mellitus II Maternal Grandfather     Social History Social History   Tobacco Use   Smoking status: Never   Smokeless tobacco: Never  Vaping Use   Vaping status: Never Used  Substance Use Topics   Alcohol use: Yes    Comment: wine occasionally   Drug use: Never    Comment: Only thing she smoked was MJ for 26 years, stopping with diagnosis of asthma at age 77     Allergies   Strawberry extract, Milk-related compounds, Pork-derived products, Shellfish allergy, Tomato, and Tree extract   Review of Systems Review of Systems  Respiratory:  Positive for shortness of breath.   Per HPI   Physical Exam Triage Vital Signs ED Triage Vitals  Encounter Vitals Group     BP 01/23/24 1810 (!) 174/118     Girls Systolic BP Percentile --      Girls Diastolic BP Percentile --      Boys Systolic BP Percentile --      Boys Diastolic BP Percentile --      Pulse Rate 01/23/24 1810 81     Resp 01/23/24 1810 (!) 24     Temp 01/23/24 1810 98.3 F (36.8 C)     Temp Source 01/23/24 1810 Oral     SpO2 01/23/24 1810 96 %     Weight --      Height --      Head Circumference --      Peak Flow --      Pain Score 01/23/24 1809 0     Pain  Loc --      Pain Education --      Exclude from Growth Chart --    No data found.  Updated Vital Signs BP (!) 174/118 (BP Location: Right Arm)   Pulse 81   Temp 98.3 F (36.8 C) (Oral)   Resp (!) 24   LMP 12/13/2023   SpO2 96%   Visual Acuity Right Eye Distance:   Left Eye Distance:   Bilateral Distance:    Right Eye Near:   Left Eye  Near:    Bilateral Near:     Physical Exam Vitals and nursing note reviewed.  Constitutional:      Appearance: She is ill-appearing. She is not toxic-appearing.  HENT:     Head: Normocephalic and atraumatic.     Right Ear: Hearing and external ear normal.     Left Ear: Hearing and external ear normal.     Nose: Nose normal.     Mouth/Throat:     Lips: Pink.     Mouth: Mucous membranes are moist.     Pharynx: No oropharyngeal exudate.  Eyes:     General: Lids are normal. Vision grossly intact. Gaze aligned appropriately.     Extraocular Movements: Extraocular movements intact.     Conjunctiva/sclera: Conjunctivae normal.  Cardiovascular:     Rate and Rhythm: Normal rate and regular rhythm.     Heart sounds: Normal heart sounds, S1 normal and S2 normal.  Pulmonary:     Effort: Tachypnea present. No respiratory distress.     Breath sounds: Normal air entry. Decreased breath sounds and wheezing present. No rhonchi or rales.     Comments: Audible wheezing heard without stethoscope on initial assessment. Speaking in short sentences with mild increased respiratory effort on initial assessment, improved significantly after breathing treatments. Abdominal:     Tenderness: There is no guarding or rebound.  Musculoskeletal:     Cervical back: Neck supple.  Skin:    General: Skin is warm and dry.     Capillary Refill: Capillary refill takes less than 2 seconds.     Findings: No rash.  Neurological:     General: No focal deficit present.     Mental Status: She is alert and oriented to person, place, and time. Mental status is at baseline.      Cranial Nerves: No dysarthria or facial asymmetry.  Psychiatric:        Mood and Affect: Mood normal.        Speech: Speech normal.        Behavior: Behavior normal.        Thought Content: Thought content normal.        Judgment: Judgment normal.      UC Treatments / Results  Labs (all labs ordered are listed, but only abnormal results are displayed) Labs Reviewed  POC SARS CORONAVIRUS 2 AG -  ED    EKG   Radiology DG Chest 2 View Result Date: 01/23/2024 CLINICAL DATA:  Shortness of breath EXAM: CHEST - 2 VIEW COMPARISON:  Chest x-ray 07/20/2023 FINDINGS: The heart size and mediastinal contours are within normal limits. Both lungs are clear. The visualized skeletal structures are unremarkable. IMPRESSION: No active cardiopulmonary disease. Electronically Signed   By: Greig Pique M.D.   On: 01/23/2024 19:14    Procedures Procedures (including critical care time)  Medications Ordered in UC Medications  ipratropium-albuterol  (DUONEB) 0.5-2.5 (3) MG/3ML nebulizer solution 3 mL (3 mLs Nebulization Given 01/23/24 1816)  methylPREDNISolone  sodium succinate (SOLU-MEDROL ) 125 mg/2 mL injection 80 mg (80 mg Intramuscular Given 01/23/24 1816)  albuterol  (PROVENTIL ) (2.5 MG/3ML) 0.083% nebulizer solution 2.5 mg (2.5 mg Nebulization Given 01/23/24 1848)    Initial Impression / Assessment and Plan / UC Course  I have reviewed the triage vital signs and the nursing notes.  Pertinent labs & imaging results that were available during my care of the patient were reviewed by me and considered in my medical decision making (see chart for details).   1.  Moderate persistent  asthma with acute exacerbation, shortness of breath Evaluation suggests asthma exacerbation.  DuoNeb and albuterol  breathing treatments given in clinic with significant improvement in shortness of breath and objective lung sounds on reassessment.  Initially, significantly decreased breath sounds on exam.  Wheezing improved  after DuoNeb and patient is moving more air. Solu-Medrol  IM 80 mg given, she is to start prednisone  tomorrow. Will treat with steroid, bronchodilator, cough suppressants for symptomatic relief, and expectorants (mucinex ) as needed.  Imaging: Chest x-ray is unremarkable for acute cardiopulmonary abnormality. Strep/viral testing: Point-of-care COVID-19 testing is negative.  Recommend follow-up with pulmonologist to discuss asthma action plan further.  Hopefully she will be able to get Dupixent  soon which will help prevent future asthma exacerbations.  2.  Elevated blood pressure reading in office with diagnosis of hypertension Blood pressure is likely elevated due to asthma exacerbation and she has not taken her antihypertensive medications today. No red flags on exam. Advised to go home, take medications, and continue lifestyle and dietary changes to reduce blood pressure naturally.  Counseled patient on potential for adverse effects with medications prescribed/recommended today, strict ER and return-to-clinic precautions discussed, patient verbalized understanding.    Final Clinical Impressions(s) / UC Diagnoses   Final diagnoses:  Moderate persistent asthma with (acute) exacerbation  Shortness of breath  Elevated blood pressure reading in office with diagnosis of hypertension     Discharge Instructions      Your symptoms are due to asthma attack. Chest x-ray is negative for pneumonia/concerning findings.  COVID-19 testing is negative here in the clinic.  - Use albuterol  every 4-6 hours as needed for cough, shortness of breath, and wheeze. -Continue using your Advair inhaler. - Take the steroid sent to the pharmacy as directed to help reduce lung inflammation and decrease the risk of another attack in the next few days. No NSAIDs like ibuprofen  or aleve/naproxen while taking steroid. Take with food to avoid stomach upset. - Take steroids starting tomorrow since I gave you a shot  of Solu-Medrol  (steroid) in the clinic today.  Please take your blood pressure medication when you go home.  Your blood pressure was very high in the clinic.  If your symptoms do not improve in the next 2-3 days with interventions, please return. Please seek medical care for new or returning symptoms, such as difficulty breathing that doesn't improve with your medications, chest pain, voice changes, high fevers, confusion, or other new or worsening symptoms. Follow-up with PCP for ongoing management of asthma.      ED Prescriptions     Medication Sig Dispense Auth. Provider   predniSONE  (DELTASONE ) 20 MG tablet Take 2 tablets (40 mg total) by mouth daily with breakfast for 5 days. 10 tablet Enedelia Dorna HERO, FNP      PDMP not reviewed this encounter.   Enedelia Dorna HERO, [place] 01/23/24 1921

## 2024-01-23 NOTE — ED Triage Notes (Signed)
 Pt has asthma. Was seen at pulmonary doctor on Friday. Yesterday started having SOB.

## 2024-01-23 NOTE — Telephone Encounter (Signed)
 Patient scheduled for DPX new start for 01/25/2024. Patient states that she does not feel comfortable with self-injections but I advised that we do not administer in office. We also can place referral to infusion center but she may have a copay per visit  Will use sample  Candee Hoon, PharmD, MPH, BCPS, CPP Clinical Pharmacist (Rheumatology and Pulmonology)

## 2024-01-24 NOTE — Progress Notes (Deleted)
 HPI Patient presents today to [facility] to see pharmacy team for Dupixent  new start.  Past medical history includes ***  Last seen on 01/17/2024 by Dr. Malka.  She has unfortunately had four ED/urgent care visits since November 2024. Last visit was yesterday - prescribed prednisone  taper  Respiratory Medications Current regimen: *** Tried in past: *** Patient reports {Adherence challenges yes no:3044014::adherence challenges,no known adherence challenges}  OBJECTIVE Allergies  Allergen Reactions   Strawberry Extract Anaphylaxis   Milk-Related Compounds Diarrhea   Pork-Derived Products Hives   Shellfish Allergy Other (See Comments)    Was told not to eat after allergy tests   Tomato    Tree Extract Other (See Comments)    Pt states unknown reation    Outpatient Encounter Medications as of 01/25/2024  Medication Sig   albuterol  (VENTOLIN  HFA) 108 (90 Base) MCG/ACT inhaler Inhale 2 puffs into the lungs every 6 (six) hours as needed for wheezing or shortness of breath.   amLODipine  (NORVASC ) 5 MG tablet Take 1 tablet (5 mg total) by mouth daily.   budesonide-glycopyrrolate-formoterol  (BREZTRI  AEROSPHERE) 160-9-4.8 MCG/ACT AERO inhaler Inhale 2 puffs into the lungs 2 (two) times daily.   fluticasone -salmeterol (ADVAIR) 500-50 MCG/ACT AEPB Inhale 1 puff into the lungs in the morning and at bedtime.   ipratropium-albuterol  (DUONEB) 0.5-2.5 (3) MG/3ML SOLN Take 3 mLs by nebulization every 6 (six) hours as needed. (Patient not taking: Reported on 01/17/2024)   loratadine  (CLARITIN ) 10 MG tablet Take 10 mg by mouth daily.   mometasone  (NASONEX ) 50 MCG/ACT nasal spray 2 sprays each nostril daily.   montelukast  (SINGULAIR ) 10 MG tablet Take 1 tablet (10 mg total) by mouth at bedtime. (Patient not taking: Reported on 01/17/2024)   predniSONE  (DELTASONE ) 20 MG tablet Take 2 tablets (40 mg total) by mouth daily with breakfast for 5 days.   Respiratory Therapy Supplies  (NEBULIZER/TUBING/MOUTHPIECE) KIT Disp one nebulizer machine, tubing set and mouthpiece kit   Spacer/Aero-Holding Chambers (AEROCHAMBER MV) inhaler Use as instructed   triamcinolone  ointment (KENALOG ) 0.1 % Apply 1 Application topically 2 (two) times daily.   umeclidinium bromide  (INCRUSE ELLIPTA ) 62.5 MCG/ACT AEPB Inhale 1 puff into the lungs daily. (Patient not taking: Reported on 01/17/2024)   No facility-administered encounter medications on file as of 01/25/2024.     Immunization History  Administered Date(s) Administered   Tdap 11/25/2017     PFTs     No data to display           Eosinophils Most recent blood eosinophil count was *** cells/microL taken on ***.   IgE: *** on ***   Assessment   Biologics training for dupilumab  (Dupixent )  Goals of therapy: Mechanism: human monoclonal IgG4 antibody that inhibits interleukin-4 and interleukin-13 cytokine-induced responses, including release of proinflammatory cytokines, chemokines, and IgE Reviewed that Dupixent  is add-on medication and patient must continue maintenance inhaler regimen. Response to therapy: may take 4 months to determine efficacy. Discussed that patients generally feel improvement sooner than 4 months.  Side effects: injection site reaction (6-18%), antibody development (5-16%), ophthalmic conjunctivitis (2-16%), transient blood eosinophilia (1-2%)  Dose: 600mg  at Week 0 (administered today in clinic) followed by 300mg  every 14 days thereafter  Administration/Storage:  Reviewed administration sites of thigh or abdomen (at least 2-3 inches away from abdomen). Reviewed the upper arm is only appropriate if caregiver is administering injection  Do not shake pen/syringe as this could lead to product foaming or precipitation. Do not use if solution is discolored or contains particulate  matter or if window on prefilled pen is yellow (indicates pen has been used).  Reviewed storage of medication in refrigerator.  Reviewed that Dupixent  can be stored at room temperature in unopened carton for up to 14 days.  Access: Approval of Dupixent  through: {specialtycoverage:25706}  Patient self-administered Dupixent  300mg /29ml x 2 (total dose 600mg ) in {injsitedsg:28167} and {injsitedsg:28167} using sample Dupixent  300mg /40mL autoinjector pen NDC: *** Lot: *** Expiration: ***  Patient monitored for 30 minutes for adverse reaction.  Patient tolerated ***.  Injection site noted. {injectionreaction:30756}  Medication Reconciliation  A drug regimen assessment was performed, including review of allergies, interactions, disease-state management, dosing and immunization history. Medications were reviewed with the patient, including name, instructions, indication, goals of therapy, potential side effects, importance of adherence, and safe use.  Drug interaction(s): ***  Immunizations  Patient is indicated for the influenzae, pneumonia, and shingles vaccinations. Patient has received *** COVID19 vaccines.   PLAN Continue Dupixent  300mg  every 14 days.  Next dose is due 02/08/2024 and every 14 days thereafter.  Rx sent to: {SpecialtyPharmacies:25705}.  Patient provided with pharmacy phone number and advised to call later this week to schedule shipment to home. Patient provided with copay card information to provide to pharmacy if quoted copay exceeds $5 per month. Continue maintenance inhaler regimen of: ***  All questions encouraged and answered.  Instructed patient to reach out with any further questions or concerns.  Thank you for allowing pharmacy to participate in this patient's care.  This appointment required *** minutes of patient care (this includes precharting, chart review, review of results, face-to-face care, etc.).

## 2024-01-25 ENCOUNTER — Ambulatory Visit: Admitting: Pharmacist

## 2024-01-25 ENCOUNTER — Other Ambulatory Visit: Admitting: Pharmacist

## 2024-01-25 DIAGNOSIS — Z7189 Other specified counseling: Secondary | ICD-10-CM

## 2024-01-25 DIAGNOSIS — J4550 Severe persistent asthma, uncomplicated: Secondary | ICD-10-CM

## 2024-01-25 MED ORDER — DUPIXENT 300 MG/2ML ~~LOC~~ SOAJ
300.0000 mg | SUBCUTANEOUS | 1 refills | Status: DC
  Filled 2024-01-29: qty 4, 28d supply, fill #0
  Filled 2024-02-16 – 2024-02-19 (×2): qty 4, 28d supply, fill #1
  Filled 2024-03-21 (×2): qty 4, 28d supply, fill #2
  Filled 2024-04-26: qty 4, 28d supply, fill #3
  Filled 2024-05-30: qty 4, 28d supply, fill #4
  Filled 2024-06-25: qty 4, 28d supply, fill #5

## 2024-01-25 NOTE — Patient Instructions (Signed)
 Your next Dupixent  dose is due on 02/08/2024, 02/22/2024, and every 14 days thereafter  CONTINUE Breztri  160-9-4.8 mcg/act. Inhale two puffs into the lungs two times a day. Singulair  10mg  tablets. Take one tablet by mouth at bedtime.  Your prescription will be shipped from [facility]. Their phone number is [phone]. Please call to schedule shipment and confirm address. They will mail your medication to your home.   You will need to be seen by your provider in 3 to 4 months to assess how Dupixent  is working for you. Please ensure you have a follow-up appointment scheduled in 3 months. Call our clinic if you need to make this appointment.  Stay up to date on all routine vaccines: influenza, pneumonia, COVID19, Shingles  How to manage an injection site reaction: Remember the 5 C's: COUNTER - leave on the counter at least 30 minutes but up to overnight to bring medication to room temperature. This may help prevent stinging COLD - place something cold (like an ice gel pack or cold water bottle) on the injection site just before cleansing with alcohol. This may help reduce pain CLARITIN  - use Claritin  (generic name is loratadine ) for the first two weeks of treatment or the day of, the day before, and the day after injecting. This will help to minimize injection site reactions CORTISONE CREAM - apply if injection site is irritated and itching CALL ME - if injection site reaction is bigger than the size of your fist, looks infected, blisters, or if you develop hives

## 2024-01-25 NOTE — Progress Notes (Signed)
 HPI Patient presents today to [facility] to see pharmacy team for Dupixent  new start.  Past medical history includes intermittent asthma   Last seen on 01/17/2024 by Dr. Malka.  She has unfortunately had four ED/urgent care visits since November 2024. Last visit was yesterday - prescribed prednisone  taper  Respiratory Medications Current regimen: Breztri  160-9-4.8 mcg/act. Inhale two puffs into the lungs two times a day. Singulair  10mg  tablets. Take one tablet by mouth at bedtime. Tried in past: Advair Patient reports no known adherence challenges  OBJECTIVE Allergies  Allergen Reactions   Strawberry Extract Anaphylaxis   Milk-Related Compounds Diarrhea   Pork-Derived Products Hives   Shellfish Allergy Other (See Comments)    Was told not to eat after allergy tests   Tomato    Tree Extract Other (See Comments)    Pt states unknown reation    Outpatient Encounter Medications as of 01/25/2024  Medication Sig   albuterol  (VENTOLIN  HFA) 108 (90 Base) MCG/ACT inhaler Inhale 2 puffs into the lungs every 6 (six) hours as needed for wheezing or shortness of breath.   amLODipine  (NORVASC ) 5 MG tablet Take 1 tablet (5 mg total) by mouth daily.   budesonide-glycopyrrolate-formoterol  (BREZTRI  AEROSPHERE) 160-9-4.8 MCG/ACT AERO inhaler Inhale 2 puffs into the lungs 2 (two) times daily.   fluticasone -salmeterol (ADVAIR) 500-50 MCG/ACT AEPB Inhale 1 puff into the lungs in the morning and at bedtime.   ipratropium-albuterol  (DUONEB) 0.5-2.5 (3) MG/3ML SOLN Take 3 mLs by nebulization every 6 (six) hours as needed. (Patient not taking: Reported on 01/17/2024)   loratadine  (CLARITIN ) 10 MG tablet Take 10 mg by mouth daily.   mometasone  (NASONEX ) 50 MCG/ACT nasal spray 2 sprays each nostril daily.   montelukast  (SINGULAIR ) 10 MG tablet Take 1 tablet (10 mg total) by mouth at bedtime. (Patient not taking: Reported on 01/17/2024)   predniSONE  (DELTASONE ) 20 MG tablet Take 2 tablets (40 mg  total) by mouth daily with breakfast for 5 days.   Respiratory Therapy Supplies (NEBULIZER/TUBING/MOUTHPIECE) KIT Disp one nebulizer machine, tubing set and mouthpiece kit   Spacer/Aero-Holding Chambers (AEROCHAMBER MV) inhaler Use as instructed   triamcinolone  ointment (KENALOG ) 0.1 % Apply 1 Application topically 2 (two) times daily.   umeclidinium bromide  (INCRUSE ELLIPTA ) 62.5 MCG/ACT AEPB Inhale 1 puff into the lungs daily. (Patient not taking: Reported on 01/17/2024)   No facility-administered encounter medications on file as of 01/25/2024.     Immunization History  Administered Date(s) Administered   Tdap 11/25/2017     PFTs     No data to display           Eosinophils Most recent blood eosinophil count was 224 cells/microL taken on 01/01/2024.   IgE: 85 on 08/01/2023   Assessment   Biologics training for dupilumab  (Dupixent )  Goals of therapy: Mechanism: human monoclonal IgG4 antibody that inhibits interleukin-4 and interleukin-13 cytokine-induced responses, including release of proinflammatory cytokines, chemokines, and IgE Reviewed that Dupixent  is add-on medication and patient must continue maintenance inhaler regimen. Response to therapy: may take 4 months to determine efficacy. Discussed that patients generally feel improvement sooner than 4 months.  Side effects: injection site reaction (6-18%), antibody development (5-16%), ophthalmic conjunctivitis (2-16%), transient blood eosinophilia (1-2%)  Dose: 600mg  at Week 0 (administered today in clinic) followed by 300mg  every 14 days thereafter  Administration/Storage:  Reviewed administration sites of thigh or abdomen (at least 2-3 inches away from abdomen). Reviewed the upper arm is only appropriate if caregiver is administering injection  Do not shake  pen/syringe as this could lead to product foaming or precipitation. Do not use if solution is discolored or contains particulate matter or if window on prefilled  pen is yellow (indicates pen has been used).  Reviewed storage of medication in refrigerator. Reviewed that Dupixent  can be stored at room temperature in unopened carton for up to 14 days.  Access: Approval of Dupixent  through: insurance  Patient self-administered Dupixent  300mg /62ml x 2 (total dose 600mg ) in right lower abdomen and left lower abdomen using sample. One injection was administered by provider in office. Dupixent  300mg /67mL autoinjector pen NDC: [id number] Lot: [id number] Expiration: 01/24/2026  Patient monitored for 30 minutes for adverse reaction.  Patient tolerated injection well. Patient was a little nervous at first but was ultimately able to one injection on her own very well.  Injection site noted. Patient denies itchiness and irritation at injection., No swelling or redness noted., and Reviewed injection site reaction management with patient verbally and printed information for review in AVS  Medication Reconciliation  A drug regimen assessment was performed, including review of allergies, interactions, disease-state management, dosing and immunization history. Medications were reviewed with the patient, including name, instructions, indication, goals of therapy, potential side effects, importance of adherence, and safe use.  Drug interaction(s): none   PLAN Continue Dupixent  300mg  every 14 days.  Next dose is due 02/08/2024 and every 14 days thereafter.  Rx sent to: [facility]: [phone] .  Patient provided with pharmacy phone number and advised to call later this week to schedule shipment to home. Patient provided with copay card information to provide to pharmacy if quoted copay exceeds $5 per month. Continue maintenance inhaler regimen of: Breztri  160-9-4.8 mcg/act. Inhale two puffs into the lungs two times a day. Singulair  10mg  tablets. Take one tablet by mouth at bedtime.  All questions encouraged and answered.  Instructed patient to reach out  with any further questions or concerns.  Thank you for allowing pharmacy to participate in this patient's care.  This appointment required 45 minutes of patient care (this includes precharting, chart review, review of results, face-to-face care, etc.).

## 2024-01-25 NOTE — Progress Notes (Deleted)
 HPI Patient presents today to [facility] to see pharmacy team for Dupixent  new start.  Past medical history includes ***  Last seen on 01/17/2024 by Dr. Malka.  She has unfortunately had four ED/urgent care visits since November 2024. Last visit was yesterday - prescribed prednisone  taper  Respiratory Medications Current regimen: *** Tried in past: *** Patient reports {Adherence challenges yes no:3044014::adherence challenges,no known adherence challenges}  OBJECTIVE Allergies  Allergen Reactions   Strawberry Extract Anaphylaxis   Milk-Related Compounds Diarrhea   Pork-Derived Products Hives   Shellfish Allergy Other (See Comments)    Was told not to eat after allergy tests   Tomato    Tree Extract Other (See Comments)    Pt states unknown reation    Outpatient Encounter Medications as of 01/25/2024  Medication Sig   albuterol  (VENTOLIN  HFA) 108 (90 Base) MCG/ACT inhaler Inhale 2 puffs into the lungs every 6 (six) hours as needed for wheezing or shortness of breath.   amLODipine  (NORVASC ) 5 MG tablet Take 1 tablet (5 mg total) by mouth daily.   budesonide-glycopyrrolate-formoterol  (BREZTRI  AEROSPHERE) 160-9-4.8 MCG/ACT AERO inhaler Inhale 2 puffs into the lungs 2 (two) times daily.   fluticasone -salmeterol (ADVAIR) 500-50 MCG/ACT AEPB Inhale 1 puff into the lungs in the morning and at bedtime.   ipratropium-albuterol  (DUONEB) 0.5-2.5 (3) MG/3ML SOLN Take 3 mLs by nebulization every 6 (six) hours as needed. (Patient not taking: Reported on 01/17/2024)   loratadine  (CLARITIN ) 10 MG tablet Take 10 mg by mouth daily.   mometasone  (NASONEX ) 50 MCG/ACT nasal spray 2 sprays each nostril daily.   montelukast  (SINGULAIR ) 10 MG tablet Take 1 tablet (10 mg total) by mouth at bedtime. (Patient not taking: Reported on 01/17/2024)   predniSONE  (DELTASONE ) 20 MG tablet Take 2 tablets (40 mg total) by mouth daily with breakfast for 5 days.   Respiratory Therapy Supplies  (NEBULIZER/TUBING/MOUTHPIECE) KIT Disp one nebulizer machine, tubing set and mouthpiece kit   Spacer/Aero-Holding Chambers (AEROCHAMBER MV) inhaler Use as instructed   triamcinolone  ointment (KENALOG ) 0.1 % Apply 1 Application topically 2 (two) times daily.   umeclidinium bromide  (INCRUSE ELLIPTA ) 62.5 MCG/ACT AEPB Inhale 1 puff into the lungs daily. (Patient not taking: Reported on 01/17/2024)   No facility-administered encounter medications on file as of 01/25/2024.     Immunization History  Administered Date(s) Administered   Tdap 11/25/2017     PFTs     No data to display           Eosinophils Most recent blood eosinophil count was *** cells/microL taken on ***.   IgE: *** on ***   Assessment   Biologics training for dupilumab  (Dupixent )  Goals of therapy: Mechanism: human monoclonal IgG4 antibody that inhibits interleukin-4 and interleukin-13 cytokine-induced responses, including release of proinflammatory cytokines, chemokines, and IgE Reviewed that Dupixent  is add-on medication and patient must continue maintenance inhaler regimen. Response to therapy: may take 4 months to determine efficacy. Discussed that patients generally feel improvement sooner than 4 months.  Side effects: injection site reaction (6-18%), antibody development (5-16%), ophthalmic conjunctivitis (2-16%), transient blood eosinophilia (1-2%)  Dose: 600mg  at Week 0 (administered today in clinic) followed by 300mg  every 14 days thereafter  Administration/Storage:  Reviewed administration sites of thigh or abdomen (at least 2-3 inches away from abdomen). Reviewed the upper arm is only appropriate if caregiver is administering injection  Do not shake pen/syringe as this could lead to product foaming or precipitation. Do not use if solution is discolored or contains particulate  matter or if window on prefilled pen is yellow (indicates pen has been used).  Reviewed storage of medication in refrigerator.  Reviewed that Dupixent  can be stored at room temperature in unopened carton for up to 14 days.  Access: Approval of Dupixent  through: {specialtycoverage:25706}  Patient self-administered Dupixent  300mg /73ml x 2 (total dose 600mg ) in {injsitedsg:28167} and {injsitedsg:28167} using sample Dupixent  300mg /66mL autoinjector pen NDC: *** Lot: *** Expiration: ***  Patient monitored for 30 minutes for adverse reaction.  Patient tolerated ***.  Injection site noted. {injectionreaction:30756}  Medication Reconciliation  A drug regimen assessment was performed, including review of allergies, interactions, disease-state management, dosing and immunization history. Medications were reviewed with the patient, including name, instructions, indication, goals of therapy, potential side effects, importance of adherence, and safe use.  Drug interaction(s): ***  Immunizations  Patient is indicated for the influenzae, pneumonia, and shingles vaccinations. Patient has received *** COVID19 vaccines.   PLAN Continue Dupixent  300mg  every 14 days.  Next dose is due 02/08/2024 and every 14 days thereafter.  Rx sent to: [facility]: [phone] .  Patient provided with pharmacy phone number and advised to call later this week to schedule shipment to home. Patient provided with copay card information to provide to pharmacy if quoted copay exceeds $5 per month. Continue maintenance inhaler regimen of: ***  All questions encouraged and answered.  Instructed patient to reach out with any further questions or concerns.  Thank you for allowing pharmacy to participate in this patient's care.  This appointment required *** minutes of patient care (this includes precharting, chart review, review of results, face-to-face care, etc.).

## 2024-01-26 ENCOUNTER — Other Ambulatory Visit: Payer: Self-pay | Admitting: Pharmacist

## 2024-01-26 NOTE — Progress Notes (Signed)
 Started Dupixent  with loading dose on 01/25/24. Continue Dupixent  300mg  every 14 days. Next dose is due 02/08/2024 and every 14 days thereafter.   She has unfortunately had four ED/urgent care visits since November 2024. Last visit was yesterday - prescribed prednisone  taper  Continue maintenance inhaler regimen of: Breztri  160-9-4.8 mcg/act. Inhale two puffs into the lungs two times a day (she will pick this up from pharmacy) Singulair  10mg  tablets.   Complete prednisone  taper

## 2024-01-29 ENCOUNTER — Other Ambulatory Visit (HOSPITAL_COMMUNITY): Payer: Self-pay

## 2024-01-29 ENCOUNTER — Other Ambulatory Visit: Payer: Self-pay

## 2024-01-29 NOTE — Progress Notes (Signed)
 Specialty Pharmacy Initial Fill Coordination Note  Autumn Lewis is a 47 y.o. female contacted today regarding initial fill of specialty medication(s) Dupilumab  (Dupixent )   Patient requested Delivery   Delivery date: 01/31/24   Verified address: [address]   [place] [address]   Medication will be filled on 01/30/2024.   Patient is aware of $0 copayment.

## 2024-01-30 ENCOUNTER — Other Ambulatory Visit: Payer: Self-pay

## 2024-02-06 DIAGNOSIS — G4733 Obstructive sleep apnea (adult) (pediatric): Secondary | ICD-10-CM | POA: Diagnosis not present

## 2024-02-07 ENCOUNTER — Ambulatory Visit: Admitting: Family Medicine

## 2024-02-08 ENCOUNTER — Telehealth: Payer: Self-pay

## 2024-02-08 NOTE — Telephone Encounter (Signed)
 Patient scheduled for re-training on 02/12/2024. Will use sample  Karletta Millay, PharmD, MPH, BCPS, CPP Clinical Pharmacist (Rheumatology and Pulmonology)

## 2024-02-08 NOTE — Telephone Encounter (Signed)
 Copied from CRM [id number]. Topic: Clinical - Medical Advice >> Feb 08, 2024  1:13 PM Rilla B wrote: Reason for CRM: Patient calling in with an issue of giving herself the Dupixen.  Patient states she needs help and she just can't inject herself. States she is grabbing her stomach but cannot do it. Patient states she needs someone to walk her through it.  Please call patient @ [phone].

## 2024-02-09 ENCOUNTER — Ambulatory Visit: Admitting: Family Medicine

## 2024-02-09 ENCOUNTER — Encounter: Payer: Self-pay | Admitting: Family Medicine

## 2024-02-09 VITALS — BP 132/82 | HR 66 | Resp 16 | Ht 65.0 in | Wt 162.0 lb

## 2024-02-09 DIAGNOSIS — M79671 Pain in right foot: Secondary | ICD-10-CM

## 2024-02-09 DIAGNOSIS — R21 Rash and other nonspecific skin eruption: Secondary | ICD-10-CM

## 2024-02-09 DIAGNOSIS — I10 Essential (primary) hypertension: Secondary | ICD-10-CM

## 2024-02-09 DIAGNOSIS — G8929 Other chronic pain: Secondary | ICD-10-CM | POA: Diagnosis not present

## 2024-02-09 DIAGNOSIS — M069 Rheumatoid arthritis, unspecified: Secondary | ICD-10-CM | POA: Diagnosis not present

## 2024-02-09 DIAGNOSIS — M5030 Other cervical disc degeneration, unspecified cervical region: Secondary | ICD-10-CM | POA: Diagnosis not present

## 2024-02-09 DIAGNOSIS — M2550 Pain in unspecified joint: Secondary | ICD-10-CM

## 2024-02-09 DIAGNOSIS — J45909 Unspecified asthma, uncomplicated: Secondary | ICD-10-CM | POA: Diagnosis not present

## 2024-02-09 DIAGNOSIS — M791 Myalgia, unspecified site: Secondary | ICD-10-CM | POA: Diagnosis not present

## 2024-02-09 MED ORDER — PREGABALIN 25 MG PO CAPS: 25.0000 mg | ORAL_CAPSULE | Freq: Two times a day (BID) | ORAL | 0 refills | Status: AC

## 2024-02-09 MED ORDER — CLOBETASOL PROPIONATE 0.05 % EX CREA: 1.0000 | TOPICAL_CREAM | Freq: Two times a day (BID) | CUTANEOUS | 1 refills | Status: AC | PRN

## 2024-02-09 MED ORDER — DULOXETINE HCL 20 MG PO CPEP: 20.0000 mg | ORAL_CAPSULE | Freq: Two times a day (BID) | ORAL | 1 refills | Status: AC

## 2024-02-09 NOTE — Progress Notes (Addendum)
 HPI 89 YOF presents today to [facility] to see pharmacy team for Dupixent  retraining.  Past medical history includes asthma and HTN.  Initial Dupixent  training with administration of loading dose was on 01/25/24 with pharmacy team. One injection administered by provider in office at that time. Patient contacted clinic on 02/08/24 requesting assistance with injection.   Number of hospitalizations in past year: 1, and 4 ED/urgent care visits since Nov 2024 for asthma. Number of asthma exacerbations in past year: 4  Respiratory Medications Current regimen:  - Maintenance inhaler: Breztri  160-9-4.8 mcg/act Inhale 2 puffs twice daily.  - Rescue inhaler: Ventolin  HFA 108 mcg/act Inhale 2 puffs every 6 hours PRN wheezing, shortness of breath  - Dupixent  300mg  [place] every 14 days.  Tried in past: Advair Diskus   Patient reports adherence challenges - difficulty self-administering Dupixent  injection.  OBJECTIVE Allergies  Allergen Reactions   Strawberry Extract Anaphylaxis   Milk-Related Compounds Diarrhea   Pork-Derived Products Hives   Shellfish Allergy Other (See Comments)    Was told not to eat after allergy tests   Tomato    Tree Extract Other (See Comments)    Pt states unknown reation    Outpatient Encounter Medications as of 02/12/2024  Medication Sig   albuterol  (VENTOLIN  HFA) 108 (90 Base) MCG/ACT inhaler Inhale 2 puffs into the lungs every 6 (six) hours as needed for wheezing or shortness of breath.   amLODipine  (NORVASC ) 5 MG tablet Take 1 tablet (5 mg total) by mouth daily.   budesonide -glycopyrrolate-formoterol  (BREZTRI  AEROSPHERE) 160-9-4.8 MCG/ACT AERO inhaler Inhale 2 puffs into the lungs 2 (two) times daily.   Dupilumab  (DUPIXENT ) 300 MG/2ML SOAJ Inject 300 mg into the skin every 14 (fourteen) days. **loading dose completed in clinic on 01/25/2024**   fluticasone -salmeterol (ADVAIR) 500-50 MCG/ACT AEPB Inhale 1 puff into the lungs in the morning and at bedtime.    loratadine  (CLARITIN ) 10 MG tablet Take 10 mg by mouth daily.   mometasone  (NASONEX ) 50 MCG/ACT nasal spray 2 sprays each nostril daily.   Respiratory Therapy Supplies (NEBULIZER/TUBING/MOUTHPIECE) KIT Disp one nebulizer machine, tubing set and mouthpiece kit   Spacer/Aero-Holding Chambers (AEROCHAMBER MV) inhaler Use as instructed   triamcinolone  ointment (KENALOG ) 0.1 % Apply 1 Application topically 2 (two) times daily.   No facility-administered encounter medications on file as of 02/12/2024.     Immunization History  Administered Date(s) Administered   Tdap 11/25/2017     PFTs     No data to display           Eosinophils Most recent blood eosinophil count was 224 cells/microL taken on 01/01/24.   IgE: 85 on 08/01/23   Assessment   Biologics training for dupilumab  (Dupixent )  Education below reviewed at initial visit 01/25/24.   Goals of therapy: Mechanism: human monoclonal IgG4 antibody that inhibits interleukin-4 and interleukin-13 cytokine-induced responses, including release of proinflammatory cytokines, chemokines, and IgE Reviewed that Dupixent  is add-on medication and patient must continue maintenance inhaler regimen. Response to therapy: may take 4 months to determine efficacy. Discussed that patients generally feel improvement sooner than 4 months.  Side effects: injection site reaction (6-18%), antibody development (5-16%), ophthalmic conjunctivitis (2-16%), transient blood eosinophilia (1-2%)  Dose: 300mg  [place] every 14 days - patient received loading dose on 01/25/24 in office.  Administration/Storage:  Reviewed administration sites of thigh or abdomen (at least 2-3 inches away from abdomen). Reviewed the upper arm is only appropriate if caregiver is administering injection  Do not shake pen/syringe as this  could lead to product foaming or precipitation. Do not use if solution is discolored or contains particulate matter or if window on prefilled pen is yellow  (indicates pen has been used).  Reviewed storage of medication in refrigerator. Reviewed that Dupixent  can be stored at room temperature in unopened carton for up to 14 days.  Access: Approval of Dupixent  through: {specialtycoverage:25706} Patient enrolled into copay card program to help with copay assistance.  Patient self-administered Dupixent  300mg /106ml in {injsitedsg:28167} using sample.  Dupixent  300mg /35mL autoinjector pen NDC: *** Lot: *** Expiration: ***  Patient monitored for 30 minutes for adverse reaction ***.  Patient tolerated ***.  Injection site noted. {injectionreaction:30756}  Medication Reconciliation  A drug regimen assessment was performed, including review of allergies, interactions, disease-state management, dosing and immunization history. Medications were reviewed with the patient, including name, instructions, indication, goals of therapy, potential side effects, importance of adherence, and safe use.  Drug interaction(s): none noted  Immunizations  Patient is indicated for the influenzae, pneumonia, and shingles vaccinations. Patient has received *** COVID19 vaccines.   PLAN Continue Dupixent  300mg  every 14 days.  Next dose is due 02/26/24 and every 14 days thereafter. Rx sent to: {SpecialtyPharmacies:25705}.  Patient provided with pharmacy phone number and advised to call later this week to schedule shipment to home. Patient provided with copay card information to provide to pharmacy if quoted copay exceeds $5 per month. Continue maintenance inhaler regimen of: Breztri  160-9-4.8 mcg/act Inhale 2 puffs twice daily.   All questions encouraged and answered.  Instructed patient to reach out with any further questions or concerns.  Thank you for allowing pharmacy to participate in this patient's care.  This appointment required *** minutes of patient care (this includes precharting, chart review, review of results, face-to-face care, etc.).

## 2024-02-09 NOTE — Progress Notes (Signed)
 Patient ID: Autumn [place], female    DOB: October 23, 1975, 48 y.o.   MRN: [id number]  PCP: Leavy Mole, PA-C  Chief Complaint  Patient presents with   Follow-up    1 month- labs and referrals   Pain    Whole body, arms hurt w/lifting. Feet and knees causing pain- can't bend toes. Discuss podiatry referral.    Subjective:   Autumn [place] is a 48 y.o. female, presents to clinic with CC of the following:  HPI    Aut[facility] [place]  -  [address] [place] [address]  Hours of Operation Monday to Friday: 9:00 AM - 6:00 PM Saturday: Open some Saturdays (varies)  Phone [phone] Fax:   Fax: [phone]   Pain everywhere still, neg autoimmune panel  No records yet from past PCP She states she tried eveyrhing and nothing helped Pain from neck down  Discussed the use of AI scribe software for clinical note transcription with the patient, who gave verbal consent to proceed.  History of Present Illness Autumn [place] is a 48 year old female with rheumatoid arthritis and degenerative disc disease who presents for follow-up on labs, symptoms, and referrals.  Musculoskeletal pain and swelling - Persistent bone and knee pain described as sore - Swelling in feet and legs, sometimes with pitting edema - Muscle spasms in feet and occasionally in legs - Difficulty walking long distances due to pain and breathlessness - Current pain management includes naproxen, ibuprofen , and Tylenol  - Concern for gastrointestinal and renal side effects from analgesics  Respiratory symptoms and sleep apnea - Asthma with recent exacerbations requiring hospitalization during the summer - Currently using Dupixent  for asthma management - Sleep apnea managed with CPAP therapy - Breathlessness with exertion  Dermatologic symptoms - Widespread eczema - Slow response to Dupixent  therapy  Care coordination and referrals - Referred to rheumatology and dermatology -  Seeking records from previous providers in [place]  to assist with current care     Patient Active Problem List   Diagnosis Date Noted   Preexisting hypertension complicating pregnancy, antepartum 08/29/2023   AMA (advanced maternal age) multigravida 35+ 08/29/2023   Supervision of high risk pregnancy, antepartum 08/29/2023   Chronic asthma 07/20/2023   Essential hypertension 11/19/2018   DDD (degenerative disc disease), cervical 11/19/2018      Current Outpatient Medications:    albuterol  (VENTOLIN  HFA) 108 (90 Base) MCG/ACT inhaler, Inhale 2 puffs into the lungs every 6 (six) hours as needed for wheezing or shortness of breath., Disp: 18 g, Rfl: 11   amLODipine  (NORVASC ) 5 MG tablet, Take 1 tablet (5 mg total) by mouth daily., Disp: 90 tablet, Rfl: 1   budesonide -glycopyrrolate-formoterol  (BREZTRI  AEROSPHERE) 160-9-4.8 MCG/ACT AERO inhaler, Inhale 2 puffs into the lungs 2 (two) times daily., Disp: 1 each, Rfl: 6   Dupilumab  (DUPIXENT ) 300 MG/2ML SOAJ, Inject 300 mg into the skin every 14 (fourteen) days. **loading dose completed in clinic on 01/25/2024**, Disp: 12 mL, Rfl: 1   fluticasone -salmeterol (ADVAIR) 500-50 MCG/ACT AEPB, Inhale 1 puff into the lungs in the morning and at bedtime., Disp: , Rfl:    loratadine  (CLARITIN ) 10 MG tablet, Take 10 mg by mouth daily., Disp: , Rfl:    mometasone  (NASONEX ) 50 MCG/ACT nasal spray, 2 sprays each nostril daily., Disp: 17 g, Rfl: 11   Respiratory Therapy Supplies (NEBULIZER/TUBING/MOUTHPIECE) KIT, Disp one nebulizer machine, tubing set and mouthpiece kit, Disp: 1 kit, Rfl: 0   Spacer/Aero-Holding Chambers (AEROCHAMBER MV)  inhaler, Use as instructed, Disp: 1 each, Rfl: 0   triamcinolone  ointment (KENALOG ) 0.1 %, Apply 1 Application topically 2 (two) times daily., Disp: 80 g, Rfl: 1   Allergies  Allergen Reactions   Strawberry Extract Anaphylaxis   Milk-Related Compounds Diarrhea   Pork-Derived Products Hives   Shellfish Allergy Other (See  Comments)    Was told not to eat after allergy tests   Tomato    Tree Extract Other (See Comments)    Pt states unknown reation     Social History   Tobacco Use   Smoking status: Never   Smokeless tobacco: Never  Vaping Use   Vaping status: Never Used  Substance Use Topics   Alcohol use: Yes    Comment: wine occasionally   Drug use: Never    Comment: Only thing she smoked was MJ for 26 years, stopping with diagnosis of asthma at age 60      Chart Review Today: I personally reviewed active problem list, medication list, allergies, family history, social history, health maintenance, notes from last encounter, lab results, imaging with the patient/caregiver today.   Review of Systems  Constitutional: Negative.   HENT: Negative.    Eyes: Negative.   Respiratory: Negative.    Cardiovascular: Negative.   Gastrointestinal: Negative.   Endocrine: Negative.   Genitourinary: Negative.   Musculoskeletal: Negative.   Skin: Negative.   Allergic/Immunologic: Negative.   Neurological: Negative.   Hematological: Negative.   Psychiatric/Behavioral: Negative.    All other systems reviewed and are negative.      Objective:   Vitals:   02/09/24 1256  BP: 132/82  Pulse: 66  Resp: 16  SpO2: 99%  Weight: 162 lb (73.5 kg)  Height: 5' 5 (1.651 m)    Body mass index is 26.96 kg/m.  Physical Exam Vitals and nursing note reviewed.  Constitutional:      General: She is not in acute distress.    Appearance: Normal appearance. She is well-developed. She is not ill-appearing, toxic-appearing or diaphoretic.  HENT:     Head: Normocephalic and atraumatic.     Right Ear: External ear normal.     Left Ear: External ear normal.     Nose: Nose normal.  Eyes:     General: No scleral icterus.       Right eye: No discharge.        Left eye: No discharge.     Conjunctiva/sclera: Conjunctivae normal.  Neck:     Trachea: No tracheal deviation.  Cardiovascular:     Rate and Rhythm:  Normal rate.  Pulmonary:     Effort: Pulmonary effort is normal. No respiratory distress.     Breath sounds: No stridor.  Skin:    General: Skin is warm and dry.     Findings: No rash.  Neurological:     Mental Status: She is alert.     Motor: No abnormal muscle tone.     Coordination: Coordination normal.     Gait: Gait normal.  Psychiatric:        Mood and Affect: Mood normal.        Behavior: Behavior normal.      Results for orders placed or performed during the hospital encounter of 01/23/24  POC SARS Coronavirus 2 Ag-ED - Nasal Swab   Collection Time: 01/23/24  6:52 PM  Result Value Ref Range   SARS Coronavirus 2 Ag Negative Negative       Assessment & Plan:   1. Essential  hypertension (Primary) BP acceptable, near goal today, pt in sig amount of pain No change to tx  2. Chronic asthma, unspecified asthma severity, unspecified whether complicated, unspecified whether persistent Per pulm  3. DDD (degenerative disc disease), cervical  - Comprehensive metabolic panel with GFR - VITAMIN D  25 Hydroxy (Vit-D Deficiency, Fractures)  4. Myalgia Everywhere - possibly FMS? - CBC with Differential/Platelet - C-reactive protein - Sedimentation rate - Rheumatoid factor - Ambulatory referral to Rheumatology - DULoxetine  (CYMBALTA ) 20 MG capsule; Take 1 capsule (20 mg total) by mouth in the morning and at bedtime.  Dispense: 180 capsule; Refill: 1 - pregabalin  (LYRICA ) 25 MG capsule; Take 1 capsule (25 mg total) by mouth 2 (two) times daily.  Dispense: 180 capsule; Refill: 0 - Comprehensive metabolic panel with GFR - VITAMIN D  25 Hydroxy (Vit-D Deficiency, Fractures)  5. Polyarthralgia Everywhere she says, arms, feet, skin, neck, joints  - CBC with Differential/Platelet - C-reactive protein - Sedimentation rate - Rheumatoid factor - Ambulatory referral to Rheumatology - DULoxetine  (CYMBALTA ) 20 MG capsule; Take 1 capsule (20 mg total) by mouth in the morning and at  bedtime.  Dispense: 180 capsule; Refill: 1 - pregabalin  (LYRICA ) 25 MG capsule; Take 1 capsule (25 mg total) by mouth 2 (two) times daily.  Dispense: 180 capsule; Refill: 0 - Comprehensive metabolic panel with GFR - VITAMIN D  25 Hydroxy (Vit-D Deficiency, Fractures)  6. Rheumatoid arthritis, involving unspecified site, unspecified whether rheumatoid factor present (HCC)  - CBC with Differential/Platelet - C-reactive protein - Sedimentation rate - Rheumatoid factor - Ambulatory referral to Rheumatology  7. Right foot pain Pain to right MTP joint and all along bottom of foot, needs to est with local podiatry - Ambulatory referral to Podiatry  8. Other chronic pain Everywhere joints, skin, muscles  Will still try to get her in with rheumatology, she is unable to tell me about any past tx or meds just that nothing helped with PCP Will try cymbalta /lyrica  and still use OTC NSAIDs and tylenol  PM&R recommended, for now we will try and get her into rheumatology - but PM&R may be very helpful for evaluating and managing sx since pain is everywhere below her head. - DULoxetine  (CYMBALTA ) 20 MG capsule; Take 1 capsule (20 mg total) by mouth in the morning and at bedtime.  Dispense: 180 capsule; Refill: 1 - pregabalin  (LYRICA ) 25 MG capsule; Take 1 capsule (25 mg total) by mouth 2 (two) times daily.  Dispense: 180 capsule; Refill: 0 - Comprehensive metabolic panel with GFR - VITAMIN D  25 Hydroxy (Vit-D Deficiency, Fractures)  9. Rash and other nonspecific skin eruption  - clobetasol  cream (TEMOVATE ) 0.05 %; Apply 1 Application topically 2 (two) times daily as needed.  Dispense: 30 g; Refill: 1 - Ambulatory referral to Dermatology  Return for 1 month f/up on pain/med adjustment.    Michelene Cower, PA-C 02/09/24 1:23 PM

## 2024-02-10 LAB — CBC WITH DIFFERENTIAL/PLATELET
Absolute Lymphocytes: 1655 {cells}/uL (ref 850–3900)
Absolute Monocytes: 434 {cells}/uL (ref 200–950)
Basophils Absolute: 19 {cells}/uL (ref 0–200)
Basophils Relative: 0.3 %
Eosinophils Absolute: 211 {cells}/uL (ref 15–500)
Eosinophils Relative: 3.4 %
HCT: 41.3 % (ref 35.0–45.0)
Hemoglobin: 13.2 g/dL (ref 11.7–15.5)
MCH: 29.9 pg (ref 27.0–33.0)
MCHC: 32 g/dL (ref 32.0–36.0)
MCV: 93.7 fL (ref 80.0–100.0)
MPV: 10.7 fL (ref 7.5–12.5)
Monocytes Relative: 7 %
Neutro Abs: 3881 {cells}/uL (ref 1500–7800)
Neutrophils Relative %: 62.6 %
Platelets: 179 Thousand/uL (ref 140–400)
RBC: 4.41 Million/uL (ref 3.80–5.10)
RDW: 13.4 % (ref 11.0–15.0)
Total Lymphocyte: 26.7 %
WBC: 6.2 Thousand/uL (ref 3.8–10.8)

## 2024-02-10 LAB — COMPREHENSIVE METABOLIC PANEL WITH GFR
AG Ratio: 1.7 (calc) (ref 1.0–2.5)
ALT: 13 U/L (ref 6–29)
AST: 16 U/L (ref 10–35)
Albumin: 3.8 g/dL (ref 3.6–5.1)
Alkaline phosphatase (APISO): 74 U/L (ref 31–125)
BUN: 16 mg/dL (ref 7–25)
CO2: 33 mmol/L — ABNORMAL HIGH (ref 20–32)
Calcium: 9.4 mg/dL (ref 8.6–10.2)
Chloride: 105 mmol/L (ref 98–110)
Creat: 0.85 mg/dL (ref 0.50–0.99)
Globulin: 2.2 g/dL (ref 1.9–3.7)
Glucose, Bld: 76 mg/dL (ref 65–99)
Potassium: 4.3 mmol/L (ref 3.5–5.3)
Sodium: 142 mmol/L (ref 135–146)
Total Bilirubin: 0.6 mg/dL (ref 0.2–1.2)
Total Protein: 6 g/dL — ABNORMAL LOW (ref 6.1–8.1)
eGFR: 84 mL/min/1.73m2 (ref >=60)

## 2024-02-10 LAB — RHEUMATOID FACTOR: Rheumatoid fact SerPl-aCnc: <10 [IU]/mL (ref <14)

## 2024-02-10 LAB — VITAMIN D 25 HYDROXY (VIT D DEFICIENCY, FRACTURES): Vit D, 25-Hydroxy: 29 ng/mL — ABNORMAL LOW (ref 30–100)

## 2024-02-10 LAB — SEDIMENTATION RATE: Sed Rate: 6 mm/h (ref 0–20)

## 2024-02-10 LAB — C-REACTIVE PROTEIN: CRP: <3 mg/L (ref <8.0)

## 2024-02-12 ENCOUNTER — Ambulatory Visit (INDEPENDENT_AMBULATORY_CARE_PROVIDER_SITE_OTHER): Admitting: Pharmacist

## 2024-02-12 DIAGNOSIS — Z7189 Other specified counseling: Secondary | ICD-10-CM

## 2024-02-12 NOTE — Patient Instructions (Addendum)
 Nice to see you today!   You were able to successfully administer Dupixent  today in clinic. Your next dose is due: 02/22/24.   We are working on resolving access issues to your controller inhaler, called Breztri .   Please keep your follow-up appointment with Dr. Malka on 04/10/24. Reach out to our team if you have any new questions or concerns!  Thanks!  Aleck Puls, PharmD, BCPS Clinical Pharmacist  [facility]

## 2024-02-15 ENCOUNTER — Telehealth: Payer: Self-pay

## 2024-02-15 ENCOUNTER — Other Ambulatory Visit (HOSPITAL_COMMUNITY): Payer: Self-pay

## 2024-02-15 MED ORDER — SPIRIVA RESPIMAT 2.5 MCG/ACT IN AERS
2.0000 | INHALATION_SPRAY | Freq: Every day | RESPIRATORY_TRACT | 12 refills | Status: DC
Start: 2024-02-15 — End: 2024-04-10

## 2024-02-15 MED ORDER — BUDESONIDE-FORMOTEROL FUMARATE 160-4.5 MCG/ACT IN AERO: 2.0000 | INHALATION_SPRAY | Freq: Two times a day (BID) | RESPIRATORY_TRACT | 12 refills | Status: DC

## 2024-02-15 NOTE — Telephone Encounter (Signed)
 I have notified the patient and sent in the prescriptions. Nothing further needed.

## 2024-02-15 NOTE — Telephone Encounter (Signed)
*  Pulm  Pharmacy Patient Advocate Encounter   Received notification from [employer] that prior authorization for Breztri  is required/requested.   Insurance verification completed.   The patient is insured through [employer] .   Per test claim:  Trial and failure of two preferred alternatives is preferred by the insurance.  If suggested medication is appropriate, Please send in a new RX and discontinue this one. If not, please advise as to why it's not appropriate so that we may request a Prior Authorization. Please note, some preferred medications may still require a PA.  If the suggested medications have not been trialed and there are no contraindications to their use, the PA will not be submitted, as it will not be approved.  '

## 2024-02-16 ENCOUNTER — Other Ambulatory Visit: Payer: Self-pay

## 2024-02-19 ENCOUNTER — Other Ambulatory Visit (HOSPITAL_COMMUNITY): Payer: Self-pay

## 2024-02-19 ENCOUNTER — Other Ambulatory Visit: Payer: Self-pay

## 2024-02-19 NOTE — Progress Notes (Signed)
 Specialty Pharmacy Refill Coordination Note  Spoke with Jesicca Dipierro is a 48 y.o. female contacted today regarding refills of specialty medication(s) Dupilumab  (Dupixent )  Doses on hand: 1 for 02/22/24  Injection date: 03/07/24   Patient requested: Delivery   Delivery date: 02/29/24   Verified address: [address] [place] [address]  Medication will be filled on 02/28/24.

## 2024-02-20 ENCOUNTER — Ambulatory Visit: Admitting: Podiatry

## 2024-02-20 ENCOUNTER — Other Ambulatory Visit (HOSPITAL_COMMUNITY): Payer: Self-pay

## 2024-02-20 ENCOUNTER — Ambulatory Visit (INDEPENDENT_AMBULATORY_CARE_PROVIDER_SITE_OTHER)

## 2024-02-20 DIAGNOSIS — M7751 Other enthesopathy of right foot: Secondary | ICD-10-CM

## 2024-02-20 DIAGNOSIS — M205X1 Other deformities of toe(s) (acquired), right foot: Secondary | ICD-10-CM | POA: Diagnosis not present

## 2024-02-20 DIAGNOSIS — G4733 Obstructive sleep apnea (adult) (pediatric): Secondary | ICD-10-CM | POA: Diagnosis not present

## 2024-02-20 DIAGNOSIS — M069 Rheumatoid arthritis, unspecified: Secondary | ICD-10-CM

## 2024-02-20 DIAGNOSIS — I10 Essential (primary) hypertension: Secondary | ICD-10-CM | POA: Diagnosis not present

## 2024-02-20 MED ORDER — CELECOXIB 200 MG PO CAPS: 200.0000 mg | ORAL_CAPSULE | Freq: Two times a day (BID) | ORAL | 1 refills | Status: DC

## 2024-02-20 NOTE — Patient Instructions (Signed)
  VISIT SUMMARY: Today, we discussed your ongoing foot pain and limited mobility, particularly in your right foot, which you attribute to a bunion. We also reviewed your history of rheumatoid arthritis and its impact on your joints. You expressed concerns about long-term use of naproxen and its potential side effects. We talked about your current footwear and the insoles you are using to alleviate pain. Additionally, we reviewed your past medical history, including asthma, sleep apnea, and high blood pressure.  YOUR PLAN: -OSTEOARTHRITIS OF RIGHT AND LEFT FIRST METATARSOPHALANGEAL JOINTS: Osteoarthritis is a condition where the cartilage in your joints wears down over time, causing pain and stiffness. You have significant cartilage loss and bone spurs in your right and left big toe joints. We will start you on Celebrex  200 mg twice daily to manage inflammation and pain. Please be aware of potential side effects like increased blood pressure and heartburn. It's important to wear supportive shoes such as Skechers, Brooks, or New Balance to reduce joint movement and pain. Orthotic insoles can provide additional support. If needed, we can consider steroid injections for inflammation relief. If conservative measures do not help, surgical options like joint fusion may be discussed.  -RHEUMATOID ARTHRITIS: Rheumatoid arthritis is an autoimmune disease that causes inflammation in your joints, leading to pain and damage. This condition is contributing to the osteoarthritis in your big toe joints. We will coordinate your care with a rheumatologist to manage your rheumatoid arthritis comprehensively. Controlling inflammation is crucial to prevent further joint damage.  -HYPERTENSION: Hypertension, or high blood pressure, can be affected by the use of Celebrex , which may increase your blood pressure. Since you have no history of heart attack or stroke, Celebrex  is a suitable option for you. We will monitor your blood  pressure regularly after you start taking Celebrex .  INSTRUCTIONS: Please schedule a follow-up visit in two months to assess the condition of your feet and the effectiveness of the treatment plan. Regularly monitor your blood pressure after starting Celebrex .                      Contains text generated by [employer]bridge.                                 Contains text generated by Abridge.

## 2024-02-21 ENCOUNTER — Ambulatory Visit: Payer: Self-pay | Admitting: Family Medicine

## 2024-02-23 NOTE — Progress Notes (Signed)
 Subjective:  Patient ID: Autumn Lewis, female    DOB: 1976-04-29,  MRN: [id number]  Chief Complaint  Patient presents with   Bunions    Rm 1 Patient is here for right foot pain in the bunion area. Patient states she is unable to flex the right toe.  Patient also has right ankle pain. Patient has a history of arthritis, currently taking naproxen for the pain.    Discussed the use of AI scribe software for clinical note transcription with the patient, who gave verbal consent to proceed.  History of Present Illness Autumn Lewis Autumn Lewis is a 48 year old female with rheumatoid arthritis who presents with foot pain and limited mobility.  She experiences significant pain in her right foot, which she attributes to a bunion. There is an inability to bend her toe, and the pain has been persistent for some time. She also notes pain in her ankles and a history of rheumatoid arthritis affecting multiple joints.  She has been using naproxen for pain management but is concerned about potential organ damage from long-term use. She has previously received steroid injections in her foot, which provided some relief. She mentions that her shoes, specifically Crocs, may be contributing to her discomfort, and she has been provided with insoles to help alleviate the pain.  Her past medical history includes asthma, sleep apnea, and high blood pressure. She reports no problems with her kidneys or liver and denies any history of heart attacks or strokes. She quit smoking due to her asthma. She also mentions eczema, which she attributes to her asthma.  She is awaiting her first appointment with a rheumatologist, as her primary doctor had difficulty diagnosing her rheumatoid arthritis initially. She expresses frustration over the delay in diagnosis despite her persistent pain.      Objective:    Physical Exam VASCULAR: DP and PT pulse palpable. Foot is warm and well-perfused. Capillary fill time is  brisk. DERMATOLOGIC: Normal skin turgor, texture, and temperature. No open lesions, rashes, or ulcerations. NEUROLOGIC: Normal sensation to light touch and pressure. No paresthesias. ORTHOPEDIC: Pain and swelling over right first MTP joint with palpable dorsal spurring and limited range of motion. Range of motion intact to left first MTP joint, tender. No ecchymosis or bruising. No gross deformity.   No images are attached to the encounter.    Results Right foot radiographs taken today show hallux limitus with dorsal spurring of the first metatarsal head    Assessment:   1. Hallux limitus, right   2. Rheumatoid arthritis involving both feet, unspecified whether rheumatoid factor present [facility])      Plan:  Patient was evaluated and treated and all questions answered.  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 Limited range of motion and pain, more severe on the right side, exacerbated by inadequate footwear. - Prescribe Celebrex  200 mg twice daily for inflammation and pain management, with attention to potential side effects such as increased blood pressure and heartburn. - Advise wearing supportive shoes such as Skechers, Brooks, or New Balance to reduce joint movement and pain. - Recommend using orthotic insoles for additional support. - Consider steroid injections into the joint for inflammation relief if needed. - Discuss surgical options such as joint fusion if conservative measures fail.  Rheumatoid arthritis Rheumatoid arthritis affecting multiple joints, contributing to  the osteoarthritis in the metatarsophalangeal joints. Awaiting initial rheumatology appointment for management. - Coordinate care with rheumatology for comprehensive management of  rheumatoid arthritis. - Emphasize the importance of controlling inflammation to prevent further joint damage.  Hypertension Hypertension may be affected by the introduction of Celebrex , which can increase blood pressure. No history of heart attack or stroke, making Celebrex  a suitable option. - Monitor blood pressure regularly after starting Celebrex .  Follow-Up Follow-up is necessary to evaluate the effectiveness of the treatment plan and make adjustments as needed. - Schedule follow-up visit in two months to assess the condition of the feet and the effectiveness of the treatment plan.      Return in about 2 months (around 04/21/2024) for f/u foot pain (new bilateral xrays if still painful).

## 2024-02-28 ENCOUNTER — Other Ambulatory Visit: Payer: Self-pay

## 2024-02-29 ENCOUNTER — Ambulatory Visit

## 2024-02-29 DIAGNOSIS — L209 Atopic dermatitis, unspecified: Secondary | ICD-10-CM | POA: Diagnosis not present

## 2024-02-29 DIAGNOSIS — L858 Other specified epidermal thickening: Secondary | ICD-10-CM | POA: Diagnosis not present

## 2024-02-29 DIAGNOSIS — L2084 Intrinsic (allergic) eczema: Secondary | ICD-10-CM

## 2024-02-29 DIAGNOSIS — L816 Other disorders of diminished melanin formation: Secondary | ICD-10-CM | POA: Diagnosis not present

## 2024-02-29 DIAGNOSIS — L818 Other specified disorders of pigmentation: Secondary | ICD-10-CM

## 2024-02-29 MED ORDER — TACROLIMUS 0.1 % EX OINT
TOPICAL_OINTMENT | CUTANEOUS | 5 refills | Status: AC
Start: 2024-02-29 — End: ?

## 2024-02-29 NOTE — Progress Notes (Signed)
   New Patient Visit   Subjective  Autumn Lewis is a 48 y.o. female who presents for the following: Eczema. Hands, elbows, chest. Started Dupixent  300 mg/2 ml 01/25/2024. Prescribed by [facility]. Has chronic asthma.   Check light spots on legs. Hair bumps on anterior thighs.   Lesions resolving with use of dupixent     The following portions of the chart were reviewed this encounter and updated as appropriate: medications, allergies, medical history  Review of Systems:  No other skin or systemic complaints except as noted in HPI or Assessment and Plan.  Objective  Well appearing patient in no apparent distress; mood and affect are within normal limits.  A focused examination was performed of the following areas: Hands, legs, face, arms  Arms and hands with faint pink plaques - Confetti-like macules of hypopigmentation scattered over the sun-exposed areas of the forearms, anterior legs and shoulders - Spiny follicular papules with mild surrounding erythema located on the posterior upper arms   Relevant exam findings are noted in the Assessment and Plan.    Assessment & Plan     Atopic dermatitis - mild, improving on dupixent  (prescribed by pulm for asthma)  Chronic and persistent condition with duration or expected duration over one year. Condition is symptomatic and bothersome to patient. Patient is flaring and not currently at treatment goal.   - Diagnosis, treatment options, prognosis, risk/ benefit, and side effects of treatment were discussed with the patient.  - Reviewed benign but chronic nature of disease. - Discussed dry skin care at length, recommended avoidance of fragrances, short showers with luke- warm water, no scrubbing, an unscented moisturizing soap (e.g. Dove sensitive skin) limited to the groin and axillae, and frequent emollient use (Eucerin, Aquaphor, Cerave, Vanicream, Vaseline). - Discussed treatment with topical steroids, non steroidal  topicals, systemics (dupixent , tralokinumab, nemolizumab, rinvoq)   - Cyclosporine, mycophenolate, azathioprine, methotrexate are older medications but can be considered  - Continue dupixent  with allergy  - Start Tacrolimus  0.1% ointment twice daily to affected areas of eczema   Idiopathic guttate hypomelanosis. Legs. - Discussed chronic nature of this condition and that there is no cure for this common cause of white spots on the skin.  - Reassured the patient that this is not vitiligo - Stressed the importance of daily sunscreen use, SPF 50 or above daily.   Keratosis pilaris Chronic and persistent condition with duration or expected duration over one year. Condition is symptomatic and bothersome to patient. Patient is flaring and not currently at treatment goal.  - informed the patient that this is a benign common inherited disorder of unknown etiology that is characterized by keratin plugging of follicular orifices - discussed with the patient that various conditions are associated with this benign condition such as: high BMI, dry scaly legs, atopy, pregnancy, and hyperandrogenism in women - discussed the treatment strategy which centers around decreasing excessive skin roughness and follicular accentuation - recommend starting a mild over-the-counter cream with alpha-hydroxy-acids (lactic acid), such as Amlactin   Return in about 2 months (around 04/30/2024) for Atopic Dermatitis Follow Up.  I, Kate Fought, CMA, am acting as scribe for Lauraine JAYSON Kanaris, MD.   Documentation: I have reviewed the above documentation for accuracy and completeness, and I agree with the above.  Lauraine JAYSON Kanaris, MD

## 2024-02-29 NOTE — Patient Instructions (Addendum)
 Thighs: We recommend an over-the-counter lotion called Amlactin.  This contains a mild acid exfoliant which can help minimize skin cell build-up.    Eczema: - For mild areas: continue triamcinolone  0.1% ointment twice daily - For severe areas: continue clobetasol  0.5% ointment twice daily - Start Tacrolimus  0.1% ointment twice daily to affected areas of eczema   Idiopathic guttate hypomelanosis. Legs. - Discussed chronic nature of this condition and that there is no cure for this common cause of white spots on the skin.  - Reassured the patient that this is not vitiligo - Stressed the importance of daily sunscreen use, SPF 50 or above daily.   Skin Care and Sun Protection  Your skin plays an important role in keeping the entire body healthy. Below are some tips on how to try and maximize skin health from the outside in.  Bathing  Bathe in mildly warm water every 1 to 2 days, followed by light drying and an application of a thick moisturizer cream or ointment, preferably one that comes in a tub.  Recommended body soaps/washes: - Cerave Hydrating Cleanser Bar - Dove Sensitive Skin Fragrance Free Beauty Bar - Aveeno Active Naturals Skin Relief Body Wash, Fragrance Free - Free & Clear (vanicream) liquid cleanser  Moisturizer  Body moisturizer: Apply a moisturizer throughout the day and after bathing.  When you moisturize after bathing, this locks in the moisture.  This can lead to softer and smoother skin.  Body moisturizers come in ointments, creams, and lotions.  If you have dry skin, we recommend the use of ointments or creams rather than lotions.  In other words, something you scoop out of a jar rather than squirted out.  Ointments and creams are thicker and thus provide better moisturization.    Recommended creams for all over: - Vanicream cream - CeraVe Moisturizing Cream - Eucerin Original Healing Soothing Repair Cream  Recommended ointments: greasy, but do the best job at  moisturization - Plain Vaseline (petroleum jelly) - CeraVe Healing ointment - Aquaphor Healing ointment  Face moisturizers: For your face, look for something that is labeled as non-comedogenic (won't clog pores) and oil-free. Your moisturizer for the day should have SPF 30 or higher in it as well, but your moisturizer for night can be without SPF. Some good examples are: - CeraVe Moisturizing Cream (can be used as a face moisturizer) - La Roche-Posay Toleriane Double Repair Facial Moisturizer with SPF 30 (my favorite for day time) - CeraVe AM (has SPF 30) - CeraVe PM  Sunscreen  Who needs sunscreen? Everyone. Sunscreen use can help prevent skin cancer by protecting you from the sun's harmful ultraviolet rays. Anyone can get skin cancer, regardless of age, gender or race. In fact, it is estimated that one in five Americans will develop skin cancer in their lifetime.  Sunscreen alone cannot fully protect you. In addition to wearing sunscreen, dermatologists recommend taking the following steps to protect your skin and find skin cancer early:  Seek shade when appropriate, remembering that the sun's rays are strongest between 10 a.m. and 2 p.m. If your shadow is shorter than you are, seek shade. Dress to protect yourself from the sun by wearing a lightweight long-sleeved shirt, pants, a wide-brimmed hat and sunglasses, when possible.  Use extra caution near water, snow and sand as they reflect the damaging rays of the sun, which can increase your chance of sunburn.  Get vitamin D  safely through a healthy diet that may include vitamin supplements. Don't seek the sun.  Avoid tanning beds. Ultraviolet light from the sun and tanning beds can cause skin cancer and wrinkling. If you want to look tan, you may wish to use a self-tanning product, but continue to use sunscreen with it.  When should I use sunscreen? Every day you go outside--even if you're just walking to and from your form of transportation.  The sun emits harmful UV rays year-round. Even on cloudy days, up to 80 percent of the sun's harmful UV rays can penetrate your skin. Snow, sand and water increase the need for sunscreen because they reflect the sun's rays.  How much sunscreen should I use, and how often should I apply it? Most people only apply 25-50 percent of the recommended amount of sunscreen. Apply enough sunscreen to cover all exposed skin. Most adults need about 1 ounce -- or enough to fill a shot glass -- to fully cover their body.  Don't forget to apply to the tops of your feet, your neck, your ears and the top of your head. Apply sunscreen to dry skin 15 minutes before going outdoors.  Skin cancer also can form on the lips. To protect your lips, apply a lip balm or lipstick that contains sunscreen with an SPF of 30 or higher.  When outdoors, reapply sunscreen approximately every two hours, or after swimming or sweating, according to the directions on the bottle.   Broad-spectrum sunscreens protect against both UVA and UVB rays. What is the difference between the rays? Sunlight consists of two types of harmful rays that reach the earth -- UVA rays and UVB rays. Overexposure to either can lead to skin cancer. In addition to causing skin cancer, here's what each of these rays do:  UVA rays (or aging rays) can prematurely age your skin, causing wrinkles and age spots, and can pass through window glass. UVB rays (or burning rays) are the primary cause of sunburn and are blocked by window glass  There is no safe way to tan. Every time you tan, you damage your skin. As this damage builds, you speed up the aging of your skin and increase your risk for all types of skin cancer.  What is the difference between chemical and physical sunscreens? Chemical sunscreens work like a sponge, absorbing the sun's rays. They contain one or more of the following active ingredients: oxybenzone, avobenzone, octisalate, octocrylene, homosalate and  octinoxate. These formulations tend to be easier to rub into the skin without leaving a white residue.   Physical sunscreens work like a shield, sitting sit on the surface of your skin and deflecting the sun's rays. They contain the active ingredients zinc oxide and/or titanium dioxide. Use this sunscreen if you have sensitive skin.   What type of sunscreen should I use? The best type of sunscreen is the one you will use again and again. Just make sure it offers broad-spectrum (UVA and UVB) protection, has an SPF of 30+, and is water-resistant. The kind of sunscreen you use is a matter of personal choice, and may vary depending on the area of the body to be protected. Available sunscreen options include lotions, creams, gels, ointments, wax sticks and sprays.  Recommended physical sunscreens for face: - Neutrogena Sheer Zinc - Aveeno Positively Mineral Sensitive - CeraVe Hydrating Mineral (also has a tinted version) - La Roche-Posay Anthelios Mineral Face (comes as a cream, lotion, light fluid, and there is also a tinted version).  - EltaMD UV Clear (also has a tinted version)  Recommended physical sunscreens for  body: - Neutrogena Sheer Zinc Dry-Touch Sunscreen Sensitive Skin Lotion Broad Spectrum SPF 50 - Aveeno Positively Mineral Sensitive Skin Sunscreen Broad Spectrum SPF 50 - La Roche-Posay Anthelios SPF 50 Mineral Sunscreen - Gentle Lotion - CeraVe Hydrating Mineral Sunscreen SPF 50  Recommended chemical sunscreens for face: - Anthelios UV Correct Face Sunscreen SPF 70 with Niacinamide - Neutrogena Clear Face Oil-Free SPF 50 with Helioplex - Neutrogena Sport Face Oil-Free SPF 70+ with Helioplex - Aveeno Protect + Hydrate Sunscreen For Face SPF 70 - La Roche-Posay Anthelios Light Fluid Sunscreen for Face SPF 60  Recommended chemical sunscreens for body: - Neutrogena Ultra Sheer Dry-Touch Sunscreen SPF 70 - Aveeno Protect + Hydrate Broad Spectrum All-Day Hydration SPF 60 (comes in a  big pump) - La Roche-Posay Anthelios Melt-In Milk Sunscreen SPF 60  Recommended UPF Clothing - Coolibar  - Solbari  - Wallaroo hats  - Materials engineer (On Amazon)    Due to recent changes in healthcare laws, you may see results of your pathology and/or laboratory studies on MyChart before the doctors have had a chance to review them. We understand that in some cases there may be results that are confusing or concerning to you. Please understand that not all results are received at the same time and often the doctors may need to interpret multiple results in order to provide you with the best plan of care or course of treatment. Therefore, we ask that you please give us  2 business days to thoroughly review all your results before contacting the office for clarification. Should we see a critical lab result, you will be contacted sooner.   If You Need Anything After Your Visit  If you have any questions or concerns for your doctor, please call our main line at [phone] and press option 4 to reach your doctor's medical assistant. If no one answers, please leave a voicemail as directed and we will return your call as soon as possible. Messages left after 4 pm will be answered the following business day.   You may also send us  a message via MyChart. We typically respond to MyChart messages within 1-2 business days.  For prescription refills, please ask your pharmacy to contact our office. Our fax number is [phone].  If you have an urgent issue when the clinic is closed that cannot wait until the next business day, you can page your doctor at the number below.    Please note that while we do our best to be available for urgent issues outside of office hours, we are not available 24/7.   If you have an urgent issue and are unable to reach us , you may choose to seek medical care at your doctor's office, retail clinic, urgent care center, or emergency room.  If you have a medical emergency, please  immediately call 911 or go to the emergency department.  Pager Numbers  - Dr. Hester: [phone]  - Dr. Jackquline: [phone]  - Dr. Claudene: [phone]   - Dr. Raymund: [phone]  In the event of inclement weather, please call our main line at [phone] for an update on the status of any delays or closures.  Dermatology Medication Tips: Please keep the boxes that topical medications come in in order to help keep track of the instructions about where and how to use these. Pharmacies typically print the medication instructions only on the boxes and not directly on the medication tubes.   If your medication is too expensive, please contact our office at [phone] option  4 or send us  a message through MyChart.   We are unable to tell what your co-pay for medications will be in advance as this is different depending on your insurance coverage. However, we may be able to find a substitute medication at lower cost or fill out paperwork to get insurance to cover a needed medication.   If a prior authorization is required to get your medication covered by your insurance company, please allow us  1-2 business days to complete this process.  Drug prices often vary depending on where the prescription is filled and some pharmacies may offer cheaper prices.  The website www.goodrx.com contains coupons for medications through different pharmacies. The prices here do not account for what the cost may be with help from insurance (it may be cheaper with your insurance), but the website can give you the price if you did not use any insurance.  - You can print the associated coupon and take it with your prescription to the pharmacy.  - You may also stop by our office during regular business hours and pick up a GoodRx coupon card.  - If you need your prescription sent electronically to a different pharmacy, notify our office through [facility] or by phone at [phone] option  4.     Si Usted Necesita Algo Despus de Su Visita  Tambin puede enviarnos un mensaje a travs de Clinical cytogeneticist. Por lo general respondemos a los mensajes de MyChart en el transcurso de 1 a 2 das hbiles.  Para renovar recetas, por favor pida a su farmacia que se ponga en contacto con nuestra oficina. Randi lakes de fax es [place] [phone].  Si tiene un asunto urgente cuando la clnica est cerrada y que no puede esperar hasta el siguiente da hbil, puede llamar/localizar a su doctor(a) al nmero que aparece a continuacin.   Por favor, tenga en cuenta que aunque hacemos todo lo posible para estar disponibles para asuntos urgentes fuera del horario de [place], no estamos disponibles las 24 horas del da, los 7 [address]  [address] de la [place].   Si tiene un problema urgente y no puede comunicarse con nosotros, puede optar por buscar atencin mdica  en el consultorio de su doctor(a), en una clnica privada, en un centro de atencin urgente o en una sala de emergencias.  Si tiene Engineer, drilling, por favor llame inmediatamente al 911 o vaya a la sala de emergencias.  Nmeros de bper  - Dr. Hester: [phone]  - Dra. Jackquline: [phone]  - Dr. Claudene: [phone]  - Dra. Kitts: [phone]  En caso de inclemencias del [place], por favor llame a nuestra lnea principal al [phone] para una actualizacin sobre el estado de cualquier retraso o cierre.  Consejos para la medicacin en dermatologa: Por favor, guarde las cajas en las que vienen los medicamentos de uso tpico para ayudarle a seguir las instrucciones sobre dnde y cmo usarlos. Las farmacias generalmente imprimen las instrucciones del medicamento slo en las cajas y no directamente en los tubos del [place].   Si su medicamento es muy caro, por favor, pngase en contacto con landry rieger llamando al [phone] y presione la opcin 4 o envenos un mensaje a travs de Clinical cytogeneticist.   No podemos decirle cul ser su copago  por los medicamentos por adelantado ya que esto es diferente dependiendo de la cobertura de su seguro. Sin embargo, es posible que podamos encontrar un medicamento sustituto a Audiological scientist un formulario para que el seguro cubra el medicamento  que se considera necesario.   Si se requiere una autorizacin previa para que su compaa de seguros malta su medicamento, por favor permtanos de 1 a 2 das hbiles para completar este proceso.  Los precios de los medicamentos varan con frecuencia dependiendo del Environmental consultant de dnde se surte la receta y alguna farmacias pueden ofrecer precios ms baratos.  El sitio web www.goodrx.com tiene cupones para medicamentos de Health and safety inspector. Los precios aqu no tienen en cuenta lo que podra costar con la ayuda del seguro (puede ser ms barato con su seguro), pero el sitio web puede darle el precio si no utiliz Tourist information centre manager.  - Puede imprimir el cupn correspondiente y llevarlo con su receta a la farmacia.  - Tambin puede pasar por nuestra oficina durante el horario de atencin regular y Education officer, museum una tarjeta de cupones de GoodRx.  - Si necesita que su receta se enve electrnicamente a una farmacia diferente, informe a nuestra oficina a travs de MyChart de [place] o por telfono llamando al [phone] y presione la opcin 4.

## 2024-03-06 LAB — GLUCOSE, POCT (MANUAL RESULT ENTRY): Glucose Fasting, POC: 102 mg/dL — AB (ref 70–99)

## 2024-03-06 NOTE — Progress Notes (Signed)
 Pt screened for HTN, fasting BS and SDOH needs. Blood pressure elevated and taken twice. She reports she stopped taking her BP medication 2 days ago because she does not like taking medication. Educated on health risks of untreated HTN. She denied CP, SOB. Did report having a little headache. Pt was referred to [facility] for further evaluation

## 2024-03-07 ENCOUNTER — Ambulatory Visit: Admitting: Physician Assistant

## 2024-03-08 DIAGNOSIS — G4733 Obstructive sleep apnea (adult) (pediatric): Secondary | ICD-10-CM | POA: Diagnosis not present

## 2024-03-11 ENCOUNTER — Encounter: Payer: Self-pay | Admitting: Family Medicine

## 2024-03-11 ENCOUNTER — Ambulatory Visit (INDEPENDENT_AMBULATORY_CARE_PROVIDER_SITE_OTHER): Admitting: Family Medicine

## 2024-03-11 VITALS — BP 138/80 | HR 76 | Resp 16 | Ht 65.0 in | Wt 160.0 lb

## 2024-03-11 DIAGNOSIS — I10 Essential (primary) hypertension: Secondary | ICD-10-CM | POA: Diagnosis not present

## 2024-03-11 DIAGNOSIS — R600 Localized edema: Secondary | ICD-10-CM | POA: Diagnosis not present

## 2024-03-11 DIAGNOSIS — H9192 Unspecified hearing loss, left ear: Secondary | ICD-10-CM | POA: Diagnosis not present

## 2024-03-11 DIAGNOSIS — M069 Rheumatoid arthritis, unspecified: Secondary | ICD-10-CM | POA: Diagnosis not present

## 2024-03-11 DIAGNOSIS — J45909 Unspecified asthma, uncomplicated: Secondary | ICD-10-CM

## 2024-03-11 DIAGNOSIS — M2550 Pain in unspecified joint: Secondary | ICD-10-CM | POA: Diagnosis not present

## 2024-03-11 DIAGNOSIS — M791 Myalgia, unspecified site: Secondary | ICD-10-CM

## 2024-03-11 DIAGNOSIS — L309 Dermatitis, unspecified: Secondary | ICD-10-CM

## 2024-03-11 DIAGNOSIS — M5030 Other cervical disc degeneration, unspecified cervical region: Secondary | ICD-10-CM | POA: Diagnosis not present

## 2024-03-11 MED ORDER — MELOXICAM 15 MG PO TABS: 15.0000 mg | ORAL_TABLET | Freq: Every day | ORAL | 1 refills | Status: AC

## 2024-03-11 NOTE — Progress Notes (Unsigned)
 Patient ID: Elveta Rape, female    DOB: Jan 31, 1976, 48 y.o.   MRN: [id number]  PCP: Leavy Mole, PA-C  Chief Complaint  Patient presents with   Medical Management of Chronic Issues   Pain    Chronic- myalgia and polyarthralgia. States in pain daily and feels like it's worsening.     Subjective:   Cerena Baine is a 48 y.o. female, presents to clinic with CC of the following:  HPI  F/up on response to meds for pain - myalgias and arthralgias, referred to rheumatology  Got celebrex  from specialists, lyrica  and cymbalta  she also started a month ago  She cannot tell a difference with any of it On dupixent  from pulmonary specialists for asthma- is helping control asthma, no flares, less wheezy and her rash/eczema is starting to improve   Something is causing LE edema - she's not sure if its amlodipine  5 mg or something else Offered to change BP med, she will try another dose to see if swelling happens again and then let us  know so we can change med Explained it may be other of her other new meds  She has rheumatology appt in a few week with [facility]  She has severe pain everywhere still from her neck down   Patient Active Problem List   Diagnosis Date Noted   Preexisting hypertension complicating pregnancy, antepartum 08/29/2023   AMA (advanced maternal age) multigravida 35+ 08/29/2023   Supervision of high risk pregnancy, antepartum 08/29/2023   Chronic asthma 07/20/2023   Essential hypertension 11/19/2018   DDD (degenerative disc disease), cervical 11/19/2018      Current Outpatient Medications:    albuterol  (VENTOLIN  HFA) 108 (90 Base) MCG/ACT inhaler, Inhale 2 puffs into the lungs every 6 (six) hours as needed for wheezing or shortness of breath., Disp: 18 g, Rfl: 11   amLODipine  (NORVASC ) 5 MG tablet, Take 1 tablet (5 mg total) by mouth daily., Disp: 90 tablet, Rfl: 1   budesonide -formoterol  (SYMBICORT ) 160-4.5 MCG/ACT inhaler, Inhale 2 puffs into the  lungs in the morning and at bedtime., Disp: 1 each, Rfl: 12   celecoxib  (CELEBREX ) 200 MG capsule, Take 1 capsule (200 mg total) by mouth 2 (two) times daily., Disp: 60 capsule, Rfl: 1   clobetasol  cream (TEMOVATE ) 0.05 %, Apply 1 Application topically 2 (two) times daily as needed., Disp: 30 g, Rfl: 1   DULoxetine  (CYMBALTA ) 20 MG capsule, Take 1 capsule (20 mg total) by mouth in the morning and at bedtime., Disp: 180 capsule, Rfl: 1   Dupilumab  (DUPIXENT ) 300 MG/2ML SOAJ, Inject 300 mg into the skin every 14 (fourteen) days. **loading dose completed in clinic on 01/25/2024**, Disp: 12 mL, Rfl: 1   loratadine  (CLARITIN ) 10 MG tablet, Take 10 mg by mouth daily., Disp: , Rfl:    mometasone  (NASONEX ) 50 MCG/ACT nasal spray, 2 sprays each nostril daily., Disp: 17 g, Rfl: 11   pregabalin  (LYRICA ) 25 MG capsule, Take 1 capsule (25 mg total) by mouth 2 (two) times daily., Disp: 180 capsule, Rfl: 0   Respiratory Therapy Supplies (NEBULIZER/TUBING/MOUTHPIECE) KIT, Disp one nebulizer machine, tubing set and mouthpiece kit, Disp: 1 kit, Rfl: 0   Spacer/Aero-Holding Chambers (AEROCHAMBER MV) inhaler, Use as instructed, Disp: 1 each, Rfl: 0   tacrolimus  (PROTOPIC ) 0.1 % ointment, Apply 2 grams twice daily to affected areas of skin, Disp: 60 g, Rfl: 5   Tiotropium Bromide Monohydrate  (SPIRIVA  RESPIMAT) 2.5 MCG/ACT AERS, Inhale 2 puffs into the lungs daily., Disp: 8  g, Rfl: 12   triamcinolone  ointment (KENALOG ) 0.1 %, Apply 1 Application topically 2 (two) times daily., Disp: 80 g, Rfl: 1   Allergies  Allergen Reactions   Strawberry Extract Anaphylaxis   Milk-Related Compounds Diarrhea   Pork-Derived Products Hives   Shellfish Allergy Other (See Comments)    Was told not to eat after allergy tests   Tomato    Tree Extract Other (See Comments)    Pt states unknown reation     Social History   Tobacco Use   Smoking status: Never   Smokeless tobacco: Never  Vaping Use   Vaping status: Never Used   Substance Use Topics   Alcohol use: Yes    Comment: wine occasionally   Drug use: Never    Comment: Only thing she smoked was MJ for 26 years, stopping with diagnosis of asthma at age 41      Chart Review Today: I personally reviewed active problem list, medication list, allergies, family history, social history, health maintenance, notes from last encounter, lab results, imaging with the patient/caregiver today.    Review of Systems  Constitutional: Negative.   HENT: Negative.    Eyes: Negative.   Respiratory: Negative.    Cardiovascular: Negative.   Gastrointestinal: Negative.   Endocrine: Negative.   Genitourinary: Negative.   Musculoskeletal: Negative.   Skin: Negative.   Allergic/Immunologic: Negative.   Neurological: Negative.   Hematological: Negative.   Psychiatric/Behavioral: Negative.    All other systems reviewed and are negative.      Objective:   Vitals:   03/11/24 1316  BP: 138/80  Pulse: 76  Resp: 16  SpO2: 97%  Weight: 160 lb (72.6 kg)  Height: 5' 5 (1.651 m)    Body mass index is 26.63 kg/m.  Physical Exam Vitals and nursing note reviewed.  Constitutional:      General: She is not in acute distress.    Appearance: Normal appearance. She is well-developed. She is not ill-appearing, toxic-appearing or diaphoretic.  HENT:     Head: Normocephalic and atraumatic.     Right Ear: External ear normal.     Left Ear: External ear normal.     Nose: Nose normal.  Eyes:     General: No scleral icterus.       Right eye: No discharge.        Left eye: No discharge.     Conjunctiva/sclera: Conjunctivae normal.  Neck:     Trachea: No tracheal deviation.  Cardiovascular:     Rate and Rhythm: Normal rate and regular rhythm.     Pulses: Normal pulses.     Heart sounds: Normal heart sounds.  Pulmonary:     Effort: Pulmonary effort is normal. No respiratory distress.     Breath sounds: Normal breath sounds. No stridor. No wheezing.  Musculoskeletal:      Cervical back: Normal range of motion. Normal range of motion.     Right lower leg: No edema.     Left lower leg: No edema.  Skin:    General: Skin is warm and dry.     Findings: Rash (improving) present.  Neurological:     Mental Status: She is alert. Mental status is at baseline.     Motor: No abnormal muscle tone.     Coordination: Coordination normal.     Gait: Gait normal.  Psychiatric:        Mood and Affect: Mood normal.        Behavior: Behavior normal.  Results for orders placed or performed in visit on 03/06/24  POCT glucose (manual entry)   Collection Time: 03/06/24 12:12 PM  Result Value Ref Range   Glucose Fasting, POC 102 (A) 70 - 99 mg/dL       Assessment & Plan:   Essential hypertension Assessment & Plan: BP above goal today on amlodipine  - she has intermittent compliance due to possibly LE edema SE She also is in a sig amount of pain again today Overall BP is better Offered to switch her to alternate HTN rx but she wants to try amlodipine  again to see if it caused the swelling or not - she will f/up with us   BP Readings from Last 3 Encounters:  03/11/24 138/80  03/06/24 (!) 154/101  02/09/24 132/82     Chronic asthma, unspecified asthma severity, unspecified whether complicated, unspecified whether persistent Assessment & Plan: Per pulmonary specialists Sig improved daily sx and no recent exacerbations with dupixent , allergy meds, nose sprays, and inhalers per Pulm   DDD (degenerative disc disease), cervical Assessment & Plan: DDD per old imaging, she asks for imaging due to neck pain but it is reproducible with palpation of muscles/ cervical paraspinal muscles and upper traps, no injury or strain, good ROM, explained limited utility of doing another xray - PM&R may be very helpful service to consult Currently no arm weakness/cervical radiculopathy   Myalgia Assessment & Plan: Continued generalized myalgias with repeated labs grossly  neg She is scheduled to see rheumatology at [facility] in a few weeks Hx of RA Consider PM&R/physiatry consult as well to help manage sx In the past month she started clebrex BID, cymbalta  20 mg BID and lyrica  25 mg BID and she cannot tell any difference at all in her generalized body/muscle/joint pain (only her face does not hurt - she has stated 2x at appts) With no benefit and multiple meds started in the past month - will not currently make changes to cymbalta  or lyrica  at this time  She wants to stop celebrex , trial mobic  in its place no other NSAIDs, she can still do topical meds, heat/ice, tylenol   -     Meloxicam ; Take 1 tablet (15 mg total) by mouth daily. Stop celebrex  when starting mobic , no other NSAIDS  Dispense: 90 tablet; Refill: 1   Polyarthralgia Assessment & Plan: See below - same plan, unchanged sx -     Meloxicam ; Take 1 tablet (15 mg total) by mouth daily. Stop celebrex  when starting mobic , no other NSAIDS  Dispense: 90 tablet; Refill: 1   Rheumatoid arthritis, involving unspecified site, unspecified whether rheumatoid factor present [facility]) Assessment & Plan: Pending rheumatology appt to est care at [facility]   Eczema, unspecified type Assessment & Plan: She recently saw dermatology and dupixent  has helped improve sx over the past couple months Many questions about plan for rash - encouraged her to review dermatology notes/recommendations and meds - read their A&P to her today   Decreased hearing of left ear canal and TM bilaterally clear only normal amount of wax, can try debrox OTC and irrigate but wax not obstructing and doubt its affecting hearing   Bilateral leg edema None today at time of exam, intermittent possibly med SE May be amlodipine  or lyrica  Pt is going to try amlodipine  dose again and let us  know if LE edema/swelling reoccurs - would change BP med in that case - discussed ACEI/ARB or HCTZ If not worse with amlodipine  it could be lyrica  SE or other  med SE  and could try med d/c to see if sx resolve   f/up as needed with BP/LE edema  Pain sx - she is following up with rheumatology, consider PM&R/physicatry referral as well (referred ordered before closing note)     Michelene Cower, PA-C 03/11/24 1:43 PM

## 2024-03-11 NOTE — Patient Instructions (Addendum)
 Physiatry /physical medicine and rehab - [facility] ?  Try debrox ear wax softening drops and gentle irrigation

## 2024-03-12 ENCOUNTER — Encounter: Payer: Self-pay | Admitting: Family Medicine

## 2024-03-12 DIAGNOSIS — M791 Myalgia, unspecified site: Secondary | ICD-10-CM | POA: Insufficient documentation

## 2024-03-12 DIAGNOSIS — L309 Dermatitis, unspecified: Secondary | ICD-10-CM | POA: Insufficient documentation

## 2024-03-12 DIAGNOSIS — M069 Rheumatoid arthritis, unspecified: Secondary | ICD-10-CM | POA: Insufficient documentation

## 2024-03-12 DIAGNOSIS — M2550 Pain in unspecified joint: Secondary | ICD-10-CM | POA: Insufficient documentation

## 2024-03-12 NOTE — Assessment & Plan Note (Signed)
 Pending rheumatology appt to est care at [facility]

## 2024-03-12 NOTE — Assessment & Plan Note (Signed)
 DDD per old imaging, she asks for imaging due to neck pain but it is reproducible with palpation of muscles/ cervical paraspinal muscles and upper traps, no injury or strain, good ROM, explained limited utility of doing another xray - PM&R may be very helpful service to consult Currently no arm weakness/cervical radiculopathy

## 2024-03-12 NOTE — Assessment & Plan Note (Signed)
 Per pulmonary specialists Sig improved daily sx and no recent exacerbations with dupixent , allergy meds, nose sprays, and inhalers per [facility]

## 2024-03-12 NOTE — Assessment & Plan Note (Signed)
 BP above goal today on amlodipine  - she has intermittent compliance due to possibly LE edema SE She also is in a sig amount of pain again today Overall BP is better Offered to switch her to alternate HTN rx but she wants to try amlodipine  again to see if it caused the swelling or not - she will f/up with us   BP Readings from Last 3 Encounters:  03/11/24 138/80  03/06/24 (!) 154/101  02/09/24 132/82

## 2024-03-12 NOTE — Assessment & Plan Note (Signed)
 She recently saw dermatology and dupixent  has helped improve sx over the past couple months Many questions about plan for rash - encouraged her to review dermatology notes/recommendations and meds - read their A&P to her today

## 2024-03-12 NOTE — Assessment & Plan Note (Signed)
 See below - same plan, unchanged sx

## 2024-03-12 NOTE — Assessment & Plan Note (Signed)
 Continued generalized myalgias with repeated labs grossly neg She is scheduled to see rheumatology at [facility] in a few weeks Hx of RA Consider PM&R/physiatry consult as well to help manage sx In the past month she started clebrex BID, cymbalta  20 mg BID and lyrica  25 mg BID and she cannot tell any difference at all in her generalized body/muscle/joint pain (only her face does not hurt - she has stated 2x at appts) With no benefit and multiple meds started in the past month - will not currently make changes to cymbalta  or lyrica  at this time  She wants to stop celebrex , trial mobic  in its place no other NSAIDs, she can still do topical meds, heat/ice, tylenol 

## 2024-03-13 ENCOUNTER — Telehealth: Payer: Self-pay | Admitting: Family Medicine

## 2024-03-13 NOTE — Telephone Encounter (Signed)
 Copied from CRM #[id number]. Topic: Referral - Question >> Mar 13, 2024 10:53 AM Mia F wrote: Reason for CRM: [place] from [facility] Pt states they are not currently accepting outside referrals therefore they are unable to see pt. Pt is aware.

## 2024-03-14 ENCOUNTER — Other Ambulatory Visit: Payer: Self-pay | Admitting: Family Medicine

## 2024-03-14 DIAGNOSIS — J45901 Unspecified asthma with (acute) exacerbation: Secondary | ICD-10-CM

## 2024-03-14 DIAGNOSIS — J45909 Unspecified asthma, uncomplicated: Secondary | ICD-10-CM

## 2024-03-15 NOTE — Telephone Encounter (Signed)
 Requested Prescriptions  Refused Prescriptions Disp Refills   ipratropium-albuterol  (DUONEB) 0.5-2.5 (3) MG/3ML SOLN [Pharmacy Med Name: IPRAT-ALBUT 0.5-3(2.5) MG/3 ML] 180 mL 1    Sig: INHALE 3 ML BY NEBULIZER EVERY 6 HOURS AS NEEDED     Pulmonology:  Combination Products - albuterol  / ipratropium Passed - 03/15/2024  1:52 PM      Passed - Last BP in normal range    BP Readings from Last 1 Encounters:  03/11/24 138/80         Passed - Last Heart Rate in normal range    Pulse Readings from Last 1 Encounters:  03/11/24 76         Passed - Valid encounter within last 12 months    Recent Outpatient Visits           4 days ago Essential hypertension   [facility] Leavy Mole, PA-C   1 month ago Essential hypertension   [facility] Leavy Mole, PA-C   2 months ago Encounter for medical examination to establish care   [facility] Leavy Mole, [place]       Future Appointments             In 1 month Raymund, Lauraine BROCKS, MD [facility]

## 2024-03-20 ENCOUNTER — Encounter (INDEPENDENT_AMBULATORY_CARE_PROVIDER_SITE_OTHER): Payer: Self-pay

## 2024-03-20 ENCOUNTER — Other Ambulatory Visit: Payer: Self-pay

## 2024-03-21 ENCOUNTER — Other Ambulatory Visit: Payer: Self-pay

## 2024-03-21 NOTE — Progress Notes (Signed)
 Specialty Pharmacy Refill Coordination Note  Autumn Lewis is a 48 y.o. female contacted today regarding refills of specialty medication(s) Dupilumab  (Dupixent )   Patient requested (Patient-Rptd) Delivery   Delivery date: 03/29/24   Verified address: (Patient-Rptd) [address] [place] [address]   Medication will be filled on 03/28/24.

## 2024-03-25 DIAGNOSIS — I10 Essential (primary) hypertension: Secondary | ICD-10-CM | POA: Diagnosis not present

## 2024-03-28 ENCOUNTER — Other Ambulatory Visit: Payer: Self-pay

## 2024-04-02 DIAGNOSIS — L732 Hidradenitis suppurativa: Secondary | ICD-10-CM | POA: Diagnosis not present

## 2024-04-02 DIAGNOSIS — M0609 Rheumatoid arthritis without rheumatoid factor, multiple sites: Secondary | ICD-10-CM | POA: Diagnosis not present

## 2024-04-02 DIAGNOSIS — Z79899 Other long term (current) drug therapy: Secondary | ICD-10-CM | POA: Diagnosis not present

## 2024-04-08 ENCOUNTER — Other Ambulatory Visit: Payer: Self-pay

## 2024-04-08 DIAGNOSIS — R0602 Shortness of breath: Secondary | ICD-10-CM

## 2024-04-10 ENCOUNTER — Other Ambulatory Visit (HOSPITAL_COMMUNITY): Payer: Self-pay

## 2024-04-10 ENCOUNTER — Other Ambulatory Visit: Payer: Self-pay | Admitting: Pulmonary Disease

## 2024-04-10 ENCOUNTER — Encounter: Payer: Self-pay | Admitting: Pulmonary Disease

## 2024-04-10 ENCOUNTER — Ambulatory Visit: Admitting: Pulmonary Disease

## 2024-04-10 ENCOUNTER — Ambulatory Visit

## 2024-04-10 VITALS — BP 134/84 | HR 84 | Temp 97.6°F | Ht 65.0 in | Wt 167.0 lb

## 2024-04-10 DIAGNOSIS — J4550 Severe persistent asthma, uncomplicated: Secondary | ICD-10-CM

## 2024-04-10 DIAGNOSIS — R0602 Shortness of breath: Secondary | ICD-10-CM

## 2024-04-10 LAB — PULMONARY FUNCTION TEST
FEF 25-75 Post: 2.28 L/s
FEF 25-75 Pre: 1.53 L/s
FEF2575-%Change-Post: 48 %
FEF2575-%Pred-Post: 79 %
FEF2575-%Pred-Pre: 53 %
FEV1-%Change-Post: 10 %
FEV1-%Pred-Post: 83 %
FEV1-%Pred-Pre: 75 %
FEV1-Post: 2.44 L
FEV1-Pre: 2.22 L
FEV1FVC-%Change-Post: 2 %
FEV1FVC-%Pred-Pre: 90 %
FEV6-%Change-Post: 7 %
FEV6-%Pred-Post: 91 %
FEV6-%Pred-Pre: 84 %
FEV6-Post: 3.28 L
FEV6-Pre: 3.04 L
FEV6FVC-%Change-Post: 0 %
FEV6FVC-%Pred-Post: 102 %
FEV6FVC-%Pred-Pre: 102 %
FVC-%Change-Post: 7 %
FVC-%Pred-Post: 89 %
FVC-%Pred-Pre: 83 %
FVC-Post: 3.3 L
FVC-Pre: 3.06 L
Post FEV1/FVC ratio: 74 %
Post FEV6/FVC ratio: 99 %
Pre FEV1/FVC ratio: 72 %
Pre FEV6/FVC Ratio: 100 %

## 2024-04-10 MED ORDER — BREZTRI AEROSPHERE 160-9-4.8 MCG/ACT IN AERO: 2.0000 | INHALATION_SPRAY | Freq: Two times a day (BID) | RESPIRATORY_TRACT | 3 refills | Status: DC

## 2024-04-10 MED ORDER — AEROCHAMBER MV MISC: 0 refills | Status: AC

## 2024-04-10 NOTE — Progress Notes (Signed)
 Synopsis: Referred in by Malka Domino, MD   Subjective:   PATIENT ID: Autumn Lewis GENDER: female DOB: 10/23/1975, MRN: [id number]  Chief Complaint  Patient presents with   Asthma    DOE. Wheezing at night. Cough. Using Albuterol  about 4 times a day.  On Dupixent . Not using Symbicort  or Spiriva .    HPI Autumn Lewis is a 48 year old female patient with a past medical history of intermittent asthma presenting today to the pulmonary clinic to establish care.  She was diagnosed with asthma 10 years ago and has been on albuterol  on an as-needed basis.  She was recently hospitalized from January 21 to January 25 with an asthma exacerbation and was discharged home on Breo.  She currently feels better however she still have difficulty breathing and using her rescue inhaler on a daily basis.  I think she understood that she needs to use Breo on an as-needed basis as well and has been using it that way.  She was never been hospitalized for an asthma exacerbation in the past.  She does report hypersensitivity to humidity cold air and strong scents.  She is not happy with advair and still having wheezing and chest tightness.  Allergen panel was mildly positive for mostly dustmites and grass.   Chest x-ray 07/20/2023 -no active cardiopulmonary disease. Family history -grandmother with asthma  Social history -never smoker-denies vaping-denies alcohol use-denies any illicit drug use.  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  She has dogs cats and birds at home.  She does not have any kids.  OV 01/17/2024 - Autumn Lewis is here to follow up regarding her asthma. She remains with significant symptoms ACT 12 . Using her rescue inhaler multiple times per day despite being on high dose ICS. We will try to get her on breztri  and escalate treatment to Biologics (Dupixent ) which will help with her asthma as well. She was also found to have obstructive sleep apnea and  is agreeable to start CPAP thereapy with nasal pillows.   OV 04/10/2024 - Autumn Lewis is here to follow up re her Severe persistent asthma. I started her on Dupixent  in 12/2023. However she is currently not on any maintenance inhaler therapy for unclear reason to me. She reported during the last visit that she was and looks like she has prescription for symbicort  and spiriva . I will replace these with Breztri  and emphasize the importance of being on maintenance therapy. She could only do Spirometry which show mild obstruction and significant response to bronchodilators in keep with Asthma. I will see her again in 6 monhts.   ROS All systems were reviewed and are negative except for the above.  Objective:   Vitals:   04/10/24 1307  BP: 134/84  Pulse: 84  Temp: 97.6 F (36.4 C)  SpO2: 99%  Weight: 167 lb (75.8 kg)  Height: 5' 5 (1.651 m)   99% on RA BMI Readings from Last 3 Encounters:  04/10/24 27.79 kg/m  04/10/24 27.72 kg/m  03/11/24 26.63 kg/m   Wt Readings from Last 3 Encounters:  04/10/24 167 lb (75.8 kg)  04/10/24 166 lb 9.6 oz (75.6 kg)  03/11/24 160 lb (72.6 kg)    Physical Exam GEN: NAD, Healthy Appearing HEENT: Supple Neck, Reactive Pupils, EOMI, erythematous nasal mucosa.  CVS: Normal S1, Normal S2, RRR, No murmurs or ES appreciated  Lungs: Poor air movement and diffuse expiratory wheezing.  Abdomen: Soft, non tender, non distended, +  BS  Extremities: Warm and well perfused, No edema    Ancillary Information   CBC    Component Value Date/Time   WBC 6.2 02/09/2024 1352   RBC 4.41 02/09/2024 1352   HGB 13.2 02/09/2024 1352   HGB 13.5 11/15/2018 1209   HCT 41.3 02/09/2024 1352   HCT 39.3 11/15/2018 1209   PLT 179 02/09/2024 1352   PLT 202 11/15/2018 1209   MCV 93.7 02/09/2024 1352   MCV 92 11/15/2018 1209   MCH 29.9 02/09/2024 1352   MCHC 32.0 02/09/2024 1352   RDW 13.4 02/09/2024 1352   RDW 13.2 11/15/2018 1209   LYMPHSABS 1.3 07/06/2023 1316    LYMPHSABS 2.0 11/15/2018 1209   MONOABS 0.5 07/06/2023 1316   EOSABS 211 02/09/2024 1352   EOSABS 0.2 11/15/2018 1209   BASOSABS 19 02/09/2024 1352   BASOSABS 0.0 11/15/2018 1209    Labs and imaging were reviewed.    Latest Ref Rng & Units 04/10/2024   10:45 AM  PFT Results  FVC-Pre L 3.06  P  FVC-Predicted Pre % 83  P  FVC-Post L 3.30  P  FVC-Predicted Post % 89  P  Pre FEV1/FVC % % 72  P  Post FEV1/FCV % % 74  P  FEV1-Pre L 2.22  P  FEV1-Predicted Pre % 75  P  FEV1-Post L 2.44  P    P Preliminary result     Assessment & Plan:  Autumn Lewis is a 48 year old female patient with a past medical history of intermittent asthma presenting today to the pulmonary clinic to establish care.  # Severe persistent asthma poorly controlled despite high dose ICS LABA #Allergic rhinitis.  EOS 200 - unable to obtain [place]. - Chest x-ray normal IgE 85 - Allergen panel mildly positive for grass and dustmites  []  Start Breztri  2 puffs twice a day. Provided Aerochambber and advised mouth rinsing.  []  Continue with albuterol  on an as-needed basis. []  c/w Dupixent  300mg  q14days ( Started 01/2024)  []  Flonase  1spray each nostril daily.    #Moderate OSA with AHI 14 []  Start auto cpap 5-15 cmH2O with HH and Nasal pillows. (She was not able to do the full face mask during titration therapy).   []  Will review resmed once available.   RTC 3 months  I spent 40 minutes caring for this patient today, including preparing to see the patient, obtaining a medical history , reviewing a separately obtained history, performing a medically appropriate examination and/or evaluation, counseling and educating the patient/family/caregiver, ordering medications, tests, or procedures, documenting clinical information in the electronic health record, and independently interpreting results (not separately reported/billed) and communicating results to the patient/family/caregiver  Darrin Barn, MD [place] 04/10/2024 1:13 PM

## 2024-04-10 NOTE — Progress Notes (Signed)
 FVC Pre/Post only. Pt had severe anxiety and claustrophobia.

## 2024-04-10 NOTE — Patient Instructions (Signed)
 Spirometry Pre/Post done today.

## 2024-04-22 NOTE — Telephone Encounter (Addendum)
 Breztri  will not be approved until the patient tries and fails two ICS/LABA. She has only failed Adviar.

## 2024-04-23 ENCOUNTER — Ambulatory Visit

## 2024-04-23 ENCOUNTER — Ambulatory Visit (INDEPENDENT_AMBULATORY_CARE_PROVIDER_SITE_OTHER): Admitting: Podiatry

## 2024-04-23 VITALS — Ht 65.0 in | Wt 167.0 lb

## 2024-04-23 DIAGNOSIS — M205X1 Other deformities of toe(s) (acquired), right foot: Secondary | ICD-10-CM

## 2024-04-23 NOTE — Progress Notes (Signed)
  Subjective:  Patient ID: Autumn Lewis, female    DOB: 1976-06-01,  MRN: [id number]  Chief Complaint  Patient presents with   Foot Pain    Rm 1 Patient states pain in right great toe.    Discussed the use of AI scribe software for clinical note transcription with the patient, who gave verbal consent to proceed.  History of Present Illness Autumn Lewis is a 48 year old female with rheumatoid arthritis who presents with foot pain and limited mobility.  She returns for follow-up today, Celebrex  has helped a little bit but still painful, the right side is the most painful      Objective:    Physical Exam VASCULAR: DP and PT pulse palpable. Foot is warm and well-perfused. Capillary fill time is brisk. DERMATOLOGIC: Normal skin turgor, texture, and temperature. No open lesions, rashes, or ulcerations. NEUROLOGIC: Normal sensation to light touch and pressure. No paresthesias. ORTHOPEDIC: Pain and swelling over right first MTP joint with palpable dorsal spurring and limited range of motion. Range of motion intact to left first MTP joint, tender. No ecchymosis or bruising. No gross deformity.   No images are attached to the encounter.    Results Right foot radiographs taken today show hallux limitus with dorsal spurring of the first metatarsal head    Assessment:   1. Hallux limitus, right      Plan:  Patient was evaluated and treated and all questions answered.  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  The right foot still quite painful.  We again discussed surgical intervention and corticosteroid injection.  Previous injections have not been helpful.  She is going to continue the Celebrex  we also discussed using a carbon fiber Morton's extension orthotic to reduce motion across the MTP joint.  She has new balance shoes coming soon which should help as well.  She will  return to see me as needed if she needs further injection or is interested in proceeding with surgical fusion of the MTP joint which we discussed today.  Discussed that we will need about 2 months to heal before returning to work full-time on her feet.      Return if symptoms worsen or fail to improve.

## 2024-04-24 NOTE — Progress Notes (Addendum)
 The patient attended a screening event on 03/06/2024 where her BP screening results was 154/101, fasting blood glucose was 102. At the event the patient noted she has [place] [employer] and does not smoke. Patient indicated having food, housing and transportation SDOH insecurities on 04/02/2024. Pt listed pcp as Michelene Cower PA-C. At the screening event pt reported she stopped taking her BP medication 2 days ago because she does not like taking medication and reported having a little headache. Pt was educated on the health's risks of untreated HTN by clinician at the screening event. Pt was referred to [facility] for further evaluation at the screening event.     Per chart review pt has a pcp and the last office visit was 03/11/2024 for essential hypertension. The pt BP was 138/80 on 03/11/2024. According to chart pt is currently on amlodipine  to manage BP. Chart review also indicates a future appt with pcp on 07/05/2024.   Post event initial f/u [facility]W called pt to f/u on SDOH needs on 04/24/2024. Pt stated she no longer needs transportation resources due to getting a car from her mother. Pt also stated she did apply for housing assistance's in [facility] but was told there is a hold on the home assistance program. Pt gave consent for CHW to send referrals through [employer] for food and housing. A referral sent through [facility] for organizations to contact pt via phone # for food and housing resources. Letter sent with  211 food and housing community resources and blood pressure resources information in case needed by pt. Additional pt f/u to be scheduled per health equity protocol.

## 2024-04-25 ENCOUNTER — Other Ambulatory Visit: Admitting: Pharmacist

## 2024-04-26 ENCOUNTER — Other Ambulatory Visit: Payer: Self-pay

## 2024-04-29 ENCOUNTER — Other Ambulatory Visit: Payer: Self-pay | Admitting: Pulmonary Disease

## 2024-04-29 DIAGNOSIS — J4550 Severe persistent asthma, uncomplicated: Secondary | ICD-10-CM

## 2024-04-30 ENCOUNTER — Encounter

## 2024-04-30 ENCOUNTER — Other Ambulatory Visit: Payer: Self-pay

## 2024-04-30 ENCOUNTER — Other Ambulatory Visit (HOSPITAL_COMMUNITY): Payer: Self-pay

## 2024-04-30 MED ORDER — FLUTICASONE-SALMETEROL 230-21 MCG/ACT IN AERO: 2.0000 | INHALATION_SPRAY | Freq: Two times a day (BID) | RESPIRATORY_TRACT | 12 refills | Status: DC

## 2024-04-30 MED ORDER — BUDESONIDE-FORMOTEROL FUMARATE 160-4.5 MCG/ACT IN AERO: 2.0000 | INHALATION_SPRAY | Freq: Two times a day (BID) | RESPIRATORY_TRACT | 12 refills | Status: AC

## 2024-04-30 NOTE — Addendum Note (Signed)
 Addended by: MALKA DOMINO on: 04/30/2024 04:06 PM   Modules accepted: Orders

## 2024-04-30 NOTE — Telephone Encounter (Signed)
 Per encounter from 10/15 Breztri  has been denied and Symbicort  has been sent in.  Nothing further needed.

## 2024-04-30 NOTE — Telephone Encounter (Signed)
 I have notified the patient. Nothing further needed.

## 2024-04-30 NOTE — Telephone Encounter (Signed)
 Prescribing symbicort ./

## 2024-04-30 NOTE — Progress Notes (Signed)
 Specialty Pharmacy Refill Coordination Note  Autumn Lewis is a 48 y.o. female contacted today regarding refills of specialty medication(s) Dupilumab  (Dupixent )   Patient requested Delivery   Delivery date: 05/10/24   Verified address: [address]  [facility] [place] [address]   Medication will be filled on: 05/09/24  Patient called back regarding dupixent  that was left out and reached room temp. Advised patient to discard per note from previous conversation.

## 2024-05-01 ENCOUNTER — Other Ambulatory Visit: Payer: Self-pay

## 2024-05-01 ENCOUNTER — Encounter: Attending: Pulmonary Disease

## 2024-05-01 DIAGNOSIS — J45909 Unspecified asthma, uncomplicated: Secondary | ICD-10-CM

## 2024-05-01 NOTE — Progress Notes (Signed)
 Virtual Visit completed. Patient informed on EP and RD appointment and 6 Minute walk test. Patient also informed of patient health questionnaires on My Chart. Patient Verbalizes understanding. Visit diagnosis can be found in [facility] 04/10/2024.

## 2024-05-02 ENCOUNTER — Ambulatory Visit

## 2024-05-08 ENCOUNTER — Encounter

## 2024-05-09 ENCOUNTER — Other Ambulatory Visit: Payer: Self-pay

## 2024-05-20 ENCOUNTER — Encounter

## 2024-05-29 ENCOUNTER — Other Ambulatory Visit (HOSPITAL_COMMUNITY): Payer: Self-pay

## 2024-05-30 ENCOUNTER — Other Ambulatory Visit (HOSPITAL_COMMUNITY): Payer: Self-pay

## 2024-06-03 ENCOUNTER — Other Ambulatory Visit: Payer: Self-pay

## 2024-06-03 ENCOUNTER — Other Ambulatory Visit (HOSPITAL_COMMUNITY): Payer: Self-pay

## 2024-06-03 NOTE — Progress Notes (Signed)
 Specialty Pharmacy Refill Coordination Note  Autumn Lewis is a 48 y.o. female contacted today regarding refills of specialty medication(s) Dupilumab  (Dupixent )   Patient requested Delivery   Delivery date: 06/05/24   Verified address: [address]  [place] [address]   Medication will be filled on: 06/04/24

## 2024-06-08 DIAGNOSIS — N898 Other specified noninflammatory disorders of vagina: Secondary | ICD-10-CM | POA: Diagnosis not present

## 2024-06-08 DIAGNOSIS — B351 Tinea unguium: Secondary | ICD-10-CM | POA: Diagnosis not present

## 2024-06-25 ENCOUNTER — Other Ambulatory Visit: Payer: Self-pay

## 2024-06-28 ENCOUNTER — Other Ambulatory Visit (HOSPITAL_COMMUNITY): Payer: Self-pay

## 2024-06-28 NOTE — Progress Notes (Signed)
 During initial follow up-The patient attended a screening event on 03/06/2024 where her BP screening results was 154/101, fasting blood glucose was 102. At the event the patient noted she has [employer] and does not smoke. Patient indicated having food, housing and transportation SDOH insecurities on 04/02/2024. Pt listed pcp as Michelene Cower PA-C. At the screening event pt reported she stopped taking her BP medication 2 days ago because she does not like taking medication and reported having a little headache. Pt was educated on the health's risks of untreated HTN by clinician at the screening event. Pt was referred to [facility] for further evaluation at the screening event.      Per chart review pt has a pcp and the last office visit was 03/11/2024 for essential hypertension. The pt BP was 138/80 on 03/11/2024. According to chart pt is currently on amlodipine  to manage BP. Chart review also indicates a future appt with pcp on 07/05/2024.    Post event initial f/u CHW called pt to f/u on SDOH needs on 04/24/2024. Pt stated she no longer needs transportation resources due to getting a car from her mother. Pt also stated she did apply for housing assistance's in [facility] [place] but was told there is a hold on the home assistance program. Pt gave consent for CHW to send referrals through [employer] for food and housing. A referral sent through [facility] for organizations to contact pt via phone # for food and housing resources. Letter sent with [place] [employer] food and housing community resources and blood pressure resources information in case needed by pt. Additional pt f/u to be scheduled per health equity protocol.  During 60 day Follow up-CHW spoke with pt and she stated she still has housing and food sdoh insecurities. Pt stated she was on a section 8 waiting list and that she re[facility] for food [facility] at [employer]. Pt stated that [facility]W could re-mail housing resources. An abnormal results  letter will be resent via Mychart. A final follow attempt will be made at a later date to see if pt sdoh status has changed.

## 2024-06-28 NOTE — Progress Notes (Signed)
 Specialty Pharmacy Refill Coordination Note  Autumn Lewis is a 49 y.o. female contacted today regarding refills of specialty medication(s) Dupilumab  (Dupixent )   Patient requested Delivery   Delivery date: 07/02/24   Verified address: [address]  [place] [address]   Medication will be filled on: 07/01/24

## 2024-07-01 ENCOUNTER — Other Ambulatory Visit: Payer: Self-pay

## 2024-07-05 ENCOUNTER — Ambulatory Visit: Admitting: Family Medicine

## 2024-07-19 ENCOUNTER — Other Ambulatory Visit: Payer: Self-pay | Admitting: Pulmonary Disease

## 2024-07-19 ENCOUNTER — Other Ambulatory Visit: Payer: Self-pay

## 2024-07-19 DIAGNOSIS — J4550 Severe persistent asthma, uncomplicated: Secondary | ICD-10-CM

## 2024-07-22 ENCOUNTER — Other Ambulatory Visit: Payer: Self-pay

## 2024-07-22 MED ORDER — DUPIXENT 300 MG/2ML ~~LOC~~ SOAJ
300.0000 mg | SUBCUTANEOUS | 1 refills | Status: AC
  Filled 2024-07-22: qty 12, 84d supply, fill #0
  Filled 2024-07-22: qty 4, 28d supply, fill #0

## 2024-07-22 NOTE — Telephone Encounter (Signed)
 Refill sent for DUPIXENT  to [facility]: [phone]   Dose: 300mg  Seba Dalkai every 14 days  Last OV: 04/10/24 Provider: Dr. Malka  Next OV: due Apr 2026  Routing to scheduling team for follow-up on appt scheduling  Aleck Puls, PharmD, BCPS Clinical Pharmacist  [facility]

## 2024-07-24 ENCOUNTER — Other Ambulatory Visit: Payer: Self-pay | Admitting: Pharmacy Technician

## 2024-07-24 ENCOUNTER — Other Ambulatory Visit: Payer: Self-pay

## 2024-07-24 NOTE — Progress Notes (Signed)
 Specialty Pharmacy Refill Coordination Note  Autumn Lewis is a 49 y.o. female contacted today regarding refills of specialty medication(s) Dupilumab  (Dupixent )   Patient requested Delivery   Delivery date: 07/31/24   Verified address: [address]  [place]   Medication will be filled on: 07/30/24

## 2024-07-30 ENCOUNTER — Other Ambulatory Visit: Payer: Self-pay

## 2024-08-14 ENCOUNTER — Ambulatory Visit: Admitting: Pulmonary Disease

## 2024-08-26 ENCOUNTER — Ambulatory Visit: Admitting: Psychiatry
